# Patient Record
Sex: Female | Born: 1990
Health system: Southern US, Community
[De-identification: ages and names within clinical notes are randomized; demographics above are authoritative.]

## PROBLEM LIST (undated history)

## (undated) DIAGNOSIS — Z3492 Encounter for supervision of normal pregnancy, unspecified, second trimester: Secondary | ICD-10-CM

## (undated) HISTORY — PX: NO PAST SURGERIES: SHX2092

## (undated) HISTORY — PX: DENTAL SURGERY: SHX609

## (undated) HISTORY — DX: Encounter for supervision of normal pregnancy, unspecified, second trimester: Z34.92

---
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
## 2010-04-22 ENCOUNTER — Emergency Department (HOSPITAL_COMMUNITY)
Admission: EM | Admit: 2010-04-22 | Discharge: 2010-04-22 | Disposition: A | Discharge status: Home / Self Care | Payer: Self-pay | Attending: Emergency Medicine | Admitting: Emergency Medicine

## 2010-04-22 DIAGNOSIS — R112 Nausea with vomiting, unspecified: Secondary | ICD-10-CM | POA: Insufficient documentation

## 2010-04-22 DIAGNOSIS — R5381 Other malaise: Secondary | ICD-10-CM | POA: Insufficient documentation

## 2010-04-22 DIAGNOSIS — R197 Diarrhea, unspecified: Secondary | ICD-10-CM | POA: Insufficient documentation

## 2010-04-29 LAB — DIFFERENTIAL
Basophils Relative: 0 % (ref 0–1)
Eosinophils Absolute: 0 10*3/uL (ref 0.0–0.7)
Monocytes Absolute: 1.6 10*3/uL — ABNORMAL HIGH (ref 0.1–1.0)
Neutro Abs: 13.3 10*3/uL — ABNORMAL HIGH (ref 1.7–7.7)
Neutrophils Relative %: 82 % — ABNORMAL HIGH (ref 43–77)

## 2010-04-29 LAB — CBC
Hemoglobin: 12.5 g/dL (ref 12.0–15.0)
MCHC: 34 g/dL (ref 30.0–36.0)
RDW: 13.8 % (ref 11.5–15.5)

## 2010-04-29 LAB — MONONUCLEOSIS SCREEN: Mono Screen: NEGATIVE

## 2010-04-29 LAB — BASIC METABOLIC PANEL
CO2: 27 mEq/L (ref 19–32)
Calcium: 9.1 mg/dL (ref 8.4–10.5)
Glucose, Bld: 94 mg/dL (ref 70–99)
Sodium: 136 mEq/L (ref 135–145)

## 2012-12-28 ENCOUNTER — Encounter (HOSPITAL_COMMUNITY): Payer: Self-pay | Admitting: Emergency Medicine

## 2012-12-28 ENCOUNTER — Emergency Department (HOSPITAL_COMMUNITY)
Admission: EM | Admit: 2012-12-28 | Discharge: 2012-12-28 | Disposition: A | Discharge status: Home / Self Care | Payer: Medicaid Other | Attending: Emergency Medicine | Admitting: Emergency Medicine

## 2012-12-28 DIAGNOSIS — Z3490 Encounter for supervision of normal pregnancy, unspecified, unspecified trimester: Secondary | ICD-10-CM

## 2012-12-28 DIAGNOSIS — Z87891 Personal history of nicotine dependence: Secondary | ICD-10-CM | POA: Insufficient documentation

## 2012-12-28 DIAGNOSIS — O210 Mild hyperemesis gravidarum: Secondary | ICD-10-CM | POA: Insufficient documentation

## 2012-12-28 DIAGNOSIS — O2390 Unspecified genitourinary tract infection in pregnancy, unspecified trimester: Secondary | ICD-10-CM | POA: Insufficient documentation

## 2012-12-28 DIAGNOSIS — N390 Urinary tract infection, site not specified: Secondary | ICD-10-CM | POA: Insufficient documentation

## 2012-12-28 DIAGNOSIS — Z79899 Other long term (current) drug therapy: Secondary | ICD-10-CM | POA: Insufficient documentation

## 2012-12-28 LAB — URINE MICROSCOPIC-ADD ON

## 2012-12-28 LAB — URINALYSIS, ROUTINE W REFLEX MICROSCOPIC
Glucose, UA: NEGATIVE mg/dL
Hgb urine dipstick: NEGATIVE
Ketones, ur: 40 mg/dL — AB
pH: 6.5 (ref 5.0–8.0)

## 2012-12-28 MED ORDER — CEPHALEXIN 500 MG PO CAPS: 500.0000 mg | ORAL_CAPSULE | Freq: Four times a day (QID) | ORAL | Status: DC

## 2012-12-28 MED ORDER — SODIUM CHLORIDE 0.9 % IV BOLUS (SEPSIS)
1000.0000 mL | Freq: Once | INTRAVENOUS | Status: AC
  Administered 2012-12-28: 1000 mL via INTRAVENOUS

## 2012-12-28 MED ORDER — ONDANSETRON HCL 4 MG/2ML IJ SOLN
4.0000 mg | Freq: Once | INTRAMUSCULAR | Status: AC
  Administered 2012-12-28: 4 mg via INTRAVENOUS
  Filled 2012-12-28: qty 2

## 2012-12-28 MED ORDER — PROMETHAZINE HCL 25 MG PO TABS: 25.0000 mg | ORAL_TABLET | Freq: Four times a day (QID) | ORAL | Status: DC | PRN

## 2012-12-28 NOTE — Progress Notes (Signed)
ED/CM noted patient did not have health insurance and/or PCP listed in the computer.  Patient was given the [facility] with information on the clinics, food pantries.   Patient expressed appreciation for information received.

## 2012-12-28 NOTE — ED Notes (Signed)
Pt states that she is feeling better, ready to go home, Dr. Adriana Simas notified,

## 2012-12-28 NOTE — ED Notes (Signed)
Pt ambulatory to restroom, tolerated well,  

## 2012-12-28 NOTE — ED Notes (Signed)
Pt c/o severe n/v. Pt is approximately 6 weeks. Pt has first ob appt with [facility] on 11/20.

## 2012-12-28 NOTE — ED Provider Notes (Signed)
CSN: [id number]     Arrival date & time 12/28/12  1020 History  This chart was scribed for Donnetta Hutching, MD by Leone Payor, ED Scribe. This patient was seen in room APA06/APA06 and the patient's care was started 11:24 AM.    Chief Complaint  Patient presents with  . Morning Sickness    The history is provided by the patient. No language interpreter was used.    HPI Comments: Marie Rivera is a 22 y.o. female who presents to the Emergency Department complaining of constant, unchanged nausea and emesis that began in the last few days. Pt states she is G1 and about [redacted] weeks pregnant. Her LNMP was in September. She has her first OB appointment at [facility] on 12/30/12. She denies vaginal bleeding, vaginal discharge.   History reviewed. No pertinent past medical history. History reviewed. No pertinent past surgical history. No family history on file. History  Substance Use Topics  . Smoking status: Former Games developer  . Smokeless tobacco: Not on file  . Alcohol Use: Not on file   OB History   Grav Para Term Preterm Abortions TAB SAB Ect Mult Living   1              Review of Systems A complete 10 system review of systems was obtained and all systems are negative except as noted in the HPI and PMH.   Allergies  Review of patient's allergies indicates no known allergies.  Home Medications   Current Outpatient Rx  Name  Route  Sig  Dispense  Refill  . Prenatal Vit-Fe Fumarate-FA (PRENATAL MULTIVITAMIN) TABS tablet   Oral   Take 1 tablet by mouth daily at 12 noon.          BP 98/49  Pulse 94  Temp(Src) 98.8 F (37.1 C)  Resp 18  Ht 5\' 2"  (1.575 m)  Wt 146 lb (66.225 kg)  BMI 26.70 kg/m2  SpO2 100%  LMP 11/04/2012 Physical Exam  Nursing note and vitals reviewed. Constitutional: She is oriented to person, place, and time. She appears well-developed and well-nourished.  HENT:  Head: Normocephalic and atraumatic.  Eyes: Conjunctivae and EOM are normal. Pupils are equal,  round, and reactive to light.  Neck: Normal range of motion. Neck supple.  Cardiovascular: Normal rate, regular rhythm and normal heart sounds.   Pulmonary/Chest: Effort normal and breath sounds normal.  Abdominal: Soft. Bowel sounds are normal. There is no tenderness.  Musculoskeletal: Normal range of motion.  Neurological: She is alert and oriented to person, place, and time.  Skin: Skin is warm and dry.  Psychiatric: She has a normal mood and affect.    ED Course  Procedures   DIAGNOSTIC STUDIES: Oxygen Saturation is 100% on RA, normal by my interpretation.    COORDINATION OF CARE: 11:24 AM Will order UA. Discussed treatment plan with pt at bedside and pt agreed to plan.   Medications  sodium chloride 0.9 % bolus 1,000 mL (not administered)  sodium chloride 0.9 % bolus 1,000 mL (not administered)  ondansetron (ZOFRAN) injection 4 mg (not administered)     Labs Review Labs Reviewed  URINALYSIS, ROUTINE W REFLEX MICROSCOPIC - Abnormal; Notable for the following:    Bilirubin Urine SMALL (*)    Ketones, ur 40 (*)    Protein, ur TRACE (*)    Leukocytes, UA MODERATE (*)    All other components within normal limits  URINE MICROSCOPIC-ADD ON - Abnormal; Notable for the following:    Squamous  Epithelial / LPF MANY (*)    Bacteria, UA MANY (*)    All other components within normal limits  URINE CULTURE   Imaging Review No results found.  EKG Interpretation   None       MDM  No diagnosis found. Patient feels much better after 2 L of IV fluids.   Urinalysis shows minor evidence of infection. Prescriptions for Phenergan 25 mg and Keflex 500 mg. Urine culture.  She has ob followup within the week   I personally performed the services described in this documentation, which was scribed in my presence. The recorded information has been reviewed and is accurate.    Donnetta Hutching, MD 12/28/12 [phone]

## 2012-12-29 ENCOUNTER — Other Ambulatory Visit: Payer: Self-pay | Admitting: Obstetrics & Gynecology

## 2012-12-29 DIAGNOSIS — O3680X0 Pregnancy with inconclusive fetal viability, not applicable or unspecified: Secondary | ICD-10-CM

## 2012-12-29 LAB — URINE CULTURE
Colony Count: NO GROWTH
Culture: NO GROWTH

## 2012-12-30 ENCOUNTER — Other Ambulatory Visit: Payer: Self-pay

## 2013-02-10 NOTE — L&D Delivery Note (Signed)
Delivery Note At 3:52 AM a viable and healthy female was delivered via Vaginal, Spontaneous Delivery (Presentation: Right Occiput Anterior).  APGAR: 9, 9; weight pending .   Placenta status: Intact, Spontaneous.  Cord: 3 vessels with the following complications: None.    Anesthesia: Epidural  Episiotomy: None Lacerations: 1st degree;Labial bilateral Suture Repair: vicryl rapide Est. Blood Loss (mL): 250  Mom to postpartum.  Baby to Couplet care / Skin to Skin.  Pt admitted with SROM and 5.5 cm dilation in active labor.  Pt progressed well to complete, labored down x1.5 hours, then pushed less than 1 hour prior to delivery. Infant placed skin to skin on mother's chest immediately after delivery.  Active management of third stage with cord traction and IV Pitocin.  Cord clamping delayed by several minutes then clamped by CNM and cut by FOB.  Pt and infant to transfer to mother/baby as couplet care without complication.  [name], Allani Reber 07/25/2013, 4:26 AM

## 2013-03-15 ENCOUNTER — Encounter (HOSPITAL_COMMUNITY): Payer: Self-pay | Admitting: Emergency Medicine

## 2013-03-15 ENCOUNTER — Emergency Department (HOSPITAL_COMMUNITY)
Admission: EM | Admit: 2013-03-15 | Discharge: 2013-03-15 | Disposition: A | Discharge status: Home / Self Care | Payer: Medicaid Other | Attending: Emergency Medicine | Admitting: Emergency Medicine

## 2013-03-15 ENCOUNTER — Emergency Department (HOSPITAL_COMMUNITY): Payer: Medicaid Other

## 2013-03-15 DIAGNOSIS — O2390 Unspecified genitourinary tract infection in pregnancy, unspecified trimester: Secondary | ICD-10-CM | POA: Insufficient documentation

## 2013-03-15 DIAGNOSIS — Z79899 Other long term (current) drug therapy: Secondary | ICD-10-CM | POA: Insufficient documentation

## 2013-03-15 DIAGNOSIS — O99330 Smoking (tobacco) complicating pregnancy, unspecified trimester: Secondary | ICD-10-CM | POA: Insufficient documentation

## 2013-03-15 DIAGNOSIS — N390 Urinary tract infection, site not specified: Secondary | ICD-10-CM

## 2013-03-15 DIAGNOSIS — O209 Hemorrhage in early pregnancy, unspecified: Secondary | ICD-10-CM | POA: Insufficient documentation

## 2013-03-15 DIAGNOSIS — R109 Unspecified abdominal pain: Secondary | ICD-10-CM | POA: Insufficient documentation

## 2013-03-15 DIAGNOSIS — R102 Pelvic and perineal pain: Secondary | ICD-10-CM

## 2013-03-15 DIAGNOSIS — Z3490 Encounter for supervision of normal pregnancy, unspecified, unspecified trimester: Secondary | ICD-10-CM

## 2013-03-15 LAB — CBC WITH DIFFERENTIAL/PLATELET
Basophils Absolute: 0 10*3/uL (ref 0.0–0.1)
Basophils Relative: 0 % (ref 0–1)
EOS ABS: 0.4 10*3/uL (ref 0.0–0.7)
Eosinophils Relative: 3 % (ref 0–5)
HCT: 31.4 % — ABNORMAL LOW (ref 36.0–46.0)
HEMOGLOBIN: 10.3 g/dL — AB (ref 12.0–15.0)
LYMPHS ABS: 1.8 10*3/uL (ref 0.7–4.0)
Lymphocytes Relative: 14 % (ref 12–46)
MCH: 26.1 pg (ref 26.0–34.0)
MCHC: 32.8 g/dL (ref 30.0–36.0)
MCV: 79.5 fL (ref 78.0–100.0)
Monocytes Absolute: 1 10*3/uL (ref 0.1–1.0)
Monocytes Relative: 8 % (ref 3–12)
NEUTROS ABS: 9.7 10*3/uL — AB (ref 1.7–7.7)
NEUTROS PCT: 75 % (ref 43–77)
PLATELETS: 289 10*3/uL (ref 150–400)
RBC: 3.95 MIL/uL (ref 3.87–5.11)
RDW: 14.1 % (ref 11.5–15.5)
WBC: 13 10*3/uL — AB (ref 4.0–10.5)

## 2013-03-15 LAB — COMPREHENSIVE METABOLIC PANEL
ALBUMIN: 3.4 g/dL — AB (ref 3.5–5.2)
ALK PHOS: 84 U/L (ref 39–117)
ALT: 35 U/L (ref 0–35)
AST: 17 U/L (ref 0–37)
BILIRUBIN TOTAL: 0.2 mg/dL — AB (ref 0.3–1.2)
BUN: 8 mg/dL (ref 6–23)
CHLORIDE: 100 meq/L (ref 96–112)
CO2: 21 mEq/L (ref 19–32)
Calcium: 9 mg/dL (ref 8.4–10.5)
Creatinine, Ser: 0.59 mg/dL (ref 0.50–1.10)
GFR calc non Af Amer: >90 mL/min (ref >90)
GLUCOSE: 97 mg/dL (ref 70–99)
POTASSIUM: 3.7 meq/L (ref 3.7–5.3)
SODIUM: 135 meq/L — AB (ref 137–147)
TOTAL PROTEIN: 7.5 g/dL (ref 6.0–8.3)

## 2013-03-15 LAB — URINE MICROSCOPIC-ADD ON

## 2013-03-15 LAB — URINALYSIS, ROUTINE W REFLEX MICROSCOPIC
BILIRUBIN URINE: NEGATIVE
GLUCOSE, UA: NEGATIVE mg/dL
KETONES UR: NEGATIVE mg/dL
Nitrite: NEGATIVE
PH: 6 (ref 5.0–8.0)
Protein, ur: NEGATIVE mg/dL
Specific Gravity, Urine: 1.025 (ref 1.005–1.030)
Urobilinogen, UA: 0.2 mg/dL (ref 0.0–1.0)

## 2013-03-15 LAB — WET PREP, GENITAL
Clue Cells Wet Prep HPF POC: NONE SEEN
TRICH WET PREP: NONE SEEN
YEAST WET PREP: NONE SEEN

## 2013-03-15 LAB — ABO/RH: ABO/RH(D): A POS

## 2013-03-15 LAB — HCG, QUANTITATIVE, PREGNANCY: hCG, Beta Chain, Quant, S: 31782 m[IU]/mL — ABNORMAL HIGH (ref <5)

## 2013-03-15 MED ORDER — CEPHALEXIN 500 MG PO CAPS: 500.0000 mg | ORAL_CAPSULE | Freq: Four times a day (QID) | ORAL | Status: DC

## 2013-03-15 NOTE — Discharge Instructions (Signed)
Abdominal Pain During Pregnancy Belly (abdominal) pain is common during pregnancy. Most of the time, it is not a serious problem. Other times, it can be a sign that something is wrong with the pregnancy. Always tell your doctor if you have belly pain. HOME CARE Monitor your belly pain for any changes. The following actions may help you feel better:  Do not have sex (intercourse) or put anything in your vagina until you feel better.  Rest until your pain stops.  Drink clear fluids if you feel sick to your stomach (nauseous). Do not eat solid food until you feel better.  Only take medicine as told by your doctor.  Keep all doctor visits as told. GET HELP RIGHT AWAY IF:   You are bleeding, leaking fluid, or pieces of tissue come out of your vagina.  You have more pain or cramping.  You keep throwing up (vomiting).  You have pain when you pee (urinate) or have blood in your pee.  You have a fever.  You do not feel your baby moving as much.  You feel very weak or feel like passing out.  You have trouble breathing, with or without belly pain.  You have a very bad headache and belly pain.  You have fluid leaking from your vagina and belly pain.  You keep having watery poop (diarrhea).  Your belly pain does not go away after resting, or the pain gets worse. MAKE SURE YOU:   Understand these instructions.  Will watch your condition.  Will get help right away if you are not doing well or get worse. Document Released: 01/15/2009 Document Revised: 09/29/2012 Document Reviewed: 08/26/2012 [facility] Patient Information 2014 [place]Care, [place].  Urinary Tract Infection A urinary tract infection (UTI) can occur any place along the urinary tract. The tract includes the kidneys, ureters, bladder, and urethra. A type of germ called bacteria often causes a UTI. UTIs are often helped with antibiotic medicine.  HOME CARE   If given, take antibiotics as told by your doctor. Finish them even  if you start to feel better.  Drink enough fluids to keep your pee (urine) clear or pale yellow.  Avoid tea, drinks with caffeine, and bubbly (carbonated) drinks.  Pee often. Avoid holding your pee in for a long time.  Pee before and after having sex (intercourse).  Wipe from front to back after you poop (bowel movement) if you are a woman. Use each tissue only once. GET HELP RIGHT AWAY IF:   You have back pain.  You have lower belly (abdominal) pain.  You have chills.  You feel sick to your stomach (nauseous).  You throw up (vomit).  Your burning or discomfort with peeing does not go away.  You have a fever.  Your symptoms are not better in 3 days. MAKE SURE YOU:   Understand these instructions.  Will watch your condition.  Will get help right away if you are not doing well or get worse. Document Released: 07/16/2007 Document Revised: 10/22/2011 Document Reviewed: 08/28/2011 [facility] Patient Information 2014 [place]Care, [place]LL[place].

## 2013-03-15 NOTE — ED Notes (Addendum)
Patient [redacted] weeks pregnant .Patient c/o constant headache and some lower abd cramping 2 days ago with small amount of blood. Patient reports taking ibuprofen almost daily.  Patient states "I also just wake up feeling miserable."  Patient reports has not been able to see OB yet due to no ride. Patient denies feeling any fetal activity.

## 2013-03-15 NOTE — ED Provider Notes (Signed)
CSN: [id number]     Arrival date & time 03/15/13  1531 History  This chart was scribed for Gilda Crease, MD by Ardelia Mems, ED Scribe. This patient was seen in room APA01/APA01 and the patient's care was started at 4:47 PM.   Chief Complaint  Patient presents with  . Headache  . Abdominal Cramping    The history is provided by the patient. No language interpreter was used.    HPI Comments: Marie Rivera is a 23 y.o. Female, who is [redacted] weeks pregnant, who presents to the Emergency Department complaining of intermittent, mild abdominal pain described as "cramping" which occurred and resolved 2 days ago. She also states that she noticed a very small amount of vaginal bleeding 2 days ago, when wiping, which concerned her. She also reports that she has not been feeling like her normal self lately and she mentioned in triage that she also had a headache 2 days ago. She denies noticing any fetal activity, such as kicking. She reports that she hasn't had an [place] since she first found out she was pregnant. She also states that she hasn't had any OB care since discovering she was pregnant, and that she has not made an appointment for this due to not having a car.   History reviewed. No pertinent past medical history. History reviewed. No pertinent past surgical history. Family History  Problem Relation Age of Onset  . Diabetes Mother    History  Substance Use Topics  . Smoking status: Current Every Day Smoker -- 0.02 packs/day for 2 years    Types: Cigarettes  . Smokeless tobacco: Never Used  . Alcohol Use: No   OB History   Grav Para Term Preterm Abortions TAB SAB Ect Mult Living   1              Review of Systems  Gastrointestinal: Positive for abdominal pain (resolved).  Genitourinary: Positive for vaginal bleeding (resolved).  Neurological: Positive for headaches (resolved).  All other systems reviewed and are negative.   Allergies  Review of patient's allergies  indicates no known allergies.  Home Medications   Current Outpatient Rx  Name  Route  Sig  Dispense  Refill  . ibuprofen (ADVIL,MOTRIN) 200 MG tablet   Oral   Take 200 mg by mouth every 6 (six) hours as needed.         . Prenatal Vit-Fe Fumarate-FA (PRENATAL MULTIVITAMIN) TABS tablet   Oral   Take 1 tablet by mouth daily at 12 noon.          Triage Vitals: BP 103/58  Pulse 98  Temp(Src) 98.6 F (37 C) (Oral)  Resp 16  Ht 5\' 2"  (1.575 m)  Wt 140 lb (63.504 kg)  BMI 25.60 kg/m2  SpO2 98%  LMP 11/04/2012  Physical Exam  Constitutional: She is oriented to person, place, and time. She appears well-developed and well-nourished. No distress.  HENT:  Head: Normocephalic and atraumatic.  Right Ear: Hearing normal.  Left Ear: Hearing normal.  Nose: Nose normal.  Mouth/Throat: Oropharynx is clear and moist and mucous membranes are normal.  Eyes: Conjunctivae and EOM are normal. Pupils are equal, round, and reactive to light.  Neck: Normal range of motion. Neck supple.  Cardiovascular: Regular rhythm, S1 normal and S2 normal.  Exam reveals no gallop and no friction rub.   No murmur heard. Pulmonary/Chest: Effort normal and breath sounds normal. No respiratory distress. She exhibits no tenderness.  Abdominal: Soft. Normal appearance and  bowel sounds are normal. There is no hepatosplenomegaly. There is no tenderness. There is no rebound, no guarding, no tenderness at McBurney's point and negative Murphy's sign. No hernia.  Gravid, but non-tender abdomen  Genitourinary: Vagina normal. There is no rash or tenderness on the right labia. There is no rash or tenderness on the left labia. Uterus is enlarged (c/w dates). Cervix exhibits friability. Cervix exhibits no motion tenderness. Right adnexum displays no mass and no tenderness. Left adnexum displays no mass and no tenderness.  Musculoskeletal: Normal range of motion.  Neurological: She is alert and oriented to person, place, and  time. She has normal strength. No cranial nerve deficit or sensory deficit. Coordination normal. GCS eye subscore is 4. GCS verbal subscore is 5. GCS motor subscore is 6.  Skin: Skin is warm, dry and intact. No rash noted. No cyanosis.  Psychiatric: She has a normal mood and affect. Her speech is normal and behavior is normal. Thought content normal.    ED Course  Procedures (including critical care time)  DIAGNOSTIC STUDIES: Oxygen Saturation is 98% on RA, normal by my interpretation.    COORDINATION OF CARE: 4:53 PM- Discussed plan to obtain an [place] along with diagnostic lab work. Will also perform a pelvic exam. Pt advised of plan for treatment and pt agrees.  Labs Review Labs Reviewed  CBC WITH DIFFERENTIAL - Abnormal; Notable for the following:    WBC 13.0 (*)    Hemoglobin 10.3 (*)    HCT 31.4 (*)    Neutro Abs 9.7 (*)    All other components within normal limits  COMPREHENSIVE METABOLIC PANEL - Abnormal; Notable for the following:    Sodium 135 (*)    Albumin 3.4 (*)    Total Bilirubin 0.2 (*)    All other components within normal limits  WET PREP, GENITAL  GC/CHLAMYDIA PROBE AMP  URINALYSIS, ROUTINE W REFLEX MICROSCOPIC  HCG, QUANTITATIVE, PREGNANCY  ABO/RH   Imaging Review US Ob Limited  03/15/2013   CLINICAL DATA:  Pregnant, cramping, no fetal heart tones  EXAM: LIMITED OBSTETRIC ULTRASOUND  FINDINGS: Number of Fetuses: 1  Heart Rate:  168 bpm  Movement: Yes  Presentation: Breech  Placental Location: Anterior  Previa: No  Amniotic Fluid (Subjective):  Within normal limits.  BPD:  4.19 cm 18 w 5 d  MATERNAL FINDINGS:  Cervix:  Appears closed.  Uterus/Adnexae:  No abnormality visualized.  IMPRESSION: Single live intrauterine gestation with positive fetal heart tones, as described above.  This exam is performed on an emergent basis and does not comprehensively evaluate fetal size, dating, or anatomy; follow-up complete OB US should be considered if further fetal assessment is  warranted.   Electronically Signed   By: Charline Bills M.D.   On: 03/15/2013 17:27    EKG Interpretation   None       MDM  Diagnosis: 1. Pregnancy 2. Abdominal cramping 3. Vaginal bleeding 4. UTI  Patient presents to the ER for evaluation of mild, intermittent abdominal cramping which is not currently present. She also had some vaginal spotting 2 days ago, which she described as blood on the tissue when she wiped. It was not heavy like a period. She describes a slight headache. She has normal neurologic exam. Examination was entirely unremarkable here in the ER. Ultrasound was performed because she is approximately [redacted] weeks pregnant without prenatal care. Ultrasound shows normal intrauterine pregnancy. Pelvic exam revealed cervical friability, likely the source of the slight spotting, but no other abnormalities.  Her urinalysis did suggest infection, and this might be a source of blood on the tissue when she wipes as well. Will be treated with Keflex. Patient reassured, Tylenol as needed for pain followup with OB/GYN.  I personally performed the services described in this documentation, which was scribed in my presence. The recorded information has been reviewed and is accurate.    Gilda Creasehristopher J. Alazar Cherian, MD 03/15/13 [id number]

## 2013-03-16 LAB — URINE CULTURE
COLONY COUNT: NO GROWTH
CULTURE: NO GROWTH

## 2013-03-16 LAB — GC/CHLAMYDIA PROBE AMP
CT PROBE, AMP APTIMA: POSITIVE — AB
GC Probe RNA: NEGATIVE

## 2013-03-17 NOTE — ED Notes (Signed)
Chart sent to EDP office for review.+ Chlamydia 

## 2013-03-19 ENCOUNTER — Telehealth (HOSPITAL_COMMUNITY): Payer: Self-pay | Admitting: Emergency Medicine

## 2013-03-20 ENCOUNTER — Telehealth (HOSPITAL_BASED_OUTPATIENT_CLINIC_OR_DEPARTMENT_OTHER): Payer: Self-pay | Admitting: Emergency Medicine

## 2013-03-21 ENCOUNTER — Encounter: Payer: Self-pay | Admitting: Women's Health

## 2013-03-22 ENCOUNTER — Encounter: Payer: Self-pay | Admitting: Advanced Practice Midwife

## 2013-03-22 NOTE — ED Notes (Signed)
Patient informed of positive results and wants rx called to [facility] [phone]

## 2013-03-29 ENCOUNTER — Encounter: Payer: Self-pay | Admitting: Adult Health

## 2013-04-04 ENCOUNTER — Other Ambulatory Visit (HOSPITAL_COMMUNITY)
Admission: RE | Admit: 2013-04-04 | Discharge: 2013-04-04 | Disposition: A | Discharge status: Home / Self Care | Payer: Medicaid Other | Source: Ambulatory Visit | Attending: Adult Health | Admitting: Adult Health

## 2013-04-04 ENCOUNTER — Ambulatory Visit (INDEPENDENT_AMBULATORY_CARE_PROVIDER_SITE_OTHER): Payer: Medicaid Other | Admitting: Adult Health

## 2013-04-04 ENCOUNTER — Other Ambulatory Visit: Payer: Self-pay | Admitting: Adult Health

## 2013-04-04 ENCOUNTER — Encounter: Payer: Self-pay | Admitting: Adult Health

## 2013-04-04 VITALS — BP 100/64 | Wt 145.0 lb

## 2013-04-04 DIAGNOSIS — Z1389 Encounter for screening for other disorder: Secondary | ICD-10-CM

## 2013-04-04 DIAGNOSIS — Z01419 Encounter for gynecological examination (general) (routine) without abnormal findings: Secondary | ICD-10-CM | POA: Insufficient documentation

## 2013-04-04 DIAGNOSIS — O0990 Supervision of high risk pregnancy, unspecified, unspecified trimester: Secondary | ICD-10-CM | POA: Insufficient documentation

## 2013-04-04 DIAGNOSIS — Z331 Pregnant state, incidental: Secondary | ICD-10-CM

## 2013-04-04 DIAGNOSIS — Z113 Encounter for screening for infections with a predominantly sexual mode of transmission: Secondary | ICD-10-CM | POA: Insufficient documentation

## 2013-04-04 DIAGNOSIS — O99019 Anemia complicating pregnancy, unspecified trimester: Secondary | ICD-10-CM

## 2013-04-04 DIAGNOSIS — Z3400 Encounter for supervision of normal first pregnancy, unspecified trimester: Secondary | ICD-10-CM

## 2013-04-04 DIAGNOSIS — Z3492 Encounter for supervision of normal pregnancy, unspecified, second trimester: Secondary | ICD-10-CM

## 2013-04-04 HISTORY — DX: Encounter for supervision of normal pregnancy, unspecified, second trimester: Z34.92

## 2013-04-04 LAB — POCT URINALYSIS DIPSTICK
Blood, UA: NEGATIVE
Glucose, UA: NEGATIVE
Ketones, UA: NEGATIVE
LEUKOCYTES UA: NEGATIVE
Nitrite, UA: NEGATIVE
PROTEIN UA: NEGATIVE

## 2013-04-04 LAB — CBC
HCT: 29.7 % — ABNORMAL LOW (ref 36.0–46.0)
Hemoglobin: 10.1 g/dL — ABNORMAL LOW (ref 12.0–15.0)
MCH: 26.2 pg (ref 26.0–34.0)
MCHC: 34 g/dL (ref 30.0–36.0)
MCV: 76.9 fL — ABNORMAL LOW (ref 78.0–100.0)
Platelets: 302 10*3/uL (ref 150–400)
RBC: 3.86 MIL/uL — ABNORMAL LOW (ref 3.87–5.11)
RDW: 15.7 % — AB (ref 11.5–15.5)
WBC: 13.2 10*3/uL — ABNORMAL HIGH (ref 4.0–10.5)

## 2013-04-04 NOTE — Progress Notes (Signed)
Subjective:  Marie Rivera is a 23 y.o. G1P0 African American female at 3938w1d by US being seen today for her first obstetrical visit.  Her obstetrical history is significant for late presentation.  Pregnancy history fully reviewed.  Patient reports no complaints. Denies vb, cramping, uti s/s, abnormal/malodorous vag d/c, or vulvovaginal itching/irritation.  BP 100/64  Wt 145 lb (65.772 kg)  LMP 11/04/2012  HISTORY: OB History  Gravida Para Term Preterm AB SAB TAB Ectopic Multiple Living  1             # Outcome Date GA Lbr Len/2nd Weight Sex Delivery Anes PTL Lv  1 CUR              Past Medical History  Diagnosis Date  . Supervision of normal pregnancy in second trimester 04/04/2013   History reviewed. No pertinent past surgical history. Family History  Problem Relation Age of Onset  . Diabetes Mother   . Hypertension Mother   . Cancer Maternal Aunt   . Diabetes Maternal Grandmother     Exam   System:     General: Well developed & nourished, no acute distress   Skin: Warm & dry, normal coloration and turgor, no rashes   Neurologic: Alert & oriented, normal mood   Cardiovascular: Regular rate & rhythm   Respiratory: Effort & rate normal, LCTAB, acyanotic   Abdomen: Soft, non tender   Extremities: normal strength, tone   Pelvic Exam:    Perineum: Normal perineum   Vulva: Normal, no lesions   Vagina:  Normal mucosa, normal discharge   Cervix: Normal, bulbous, appears closed   Uterus: Normal size/shape/contour for GA   Thin prep pap smear with GC/CHL done FHR 164 via doppler   Assessment:   Pregnancy: G1P0 Patient Active Problem List   Diagnosis Date Noted  . Supervision of normal pregnancy in second trimester 04/04/2013    7038w1d G1P0 New OB visit     Plan:  Initial labs drawn Continue prenatal vitamins Problem list reviewed and updated Reviewed n/v relief measures and warning s/s to report Reviewed recommended weight gain based on pre-gravid  BMI Encouraged well-balanced diet Genetic Screening discussed Quad Screen: requested Cystic fibrosis screening discussed requested Ultrasound discussed; fetal survey: requested Follow up in 2 weeks for US and see [name]  Adline PotterJennifer A. Griffin, NP 04/04/2013 3:34 PM

## 2013-04-04 NOTE — Patient Instructions (Signed)
Second Trimester of Pregnancy The second trimester is from week 13 through week 28, months 4 through 6. The second trimester is often a time when you feel your best. Your body has also adjusted to being pregnant, and you begin to feel better physically. Usually, morning sickness has lessened or quit completely, you may have more energy, and you may have an increase in appetite. The second trimester is also a time when the fetus is growing rapidly. At the end of the sixth month, the fetus is about 9 inches long and weighs about 1 pounds. You will likely begin to feel the baby move (quickening) between 18 and 20 weeks of the pregnancy. BODY CHANGES Your body goes through many changes during pregnancy. The changes vary from woman to woman.   Your weight will continue to increase. You will notice your lower abdomen bulging out.  You may begin to get stretch marks on your hips, abdomen, and breasts.  You may develop headaches that can be relieved by medicines approved by your caregiver.  You may urinate more often because the fetus is pressing on your bladder.  You may develop or continue to have heartburn as a result of your pregnancy.  You may develop constipation because certain hormones are causing the muscles that push waste through your intestines to slow down.  You may develop hemorrhoids or swollen, bulging veins (varicose veins).  You may have back pain because of the weight gain and pregnancy hormones relaxing your joints between the bones in your pelvis and as a result of a shift in weight and the muscles that support your balance.  Your breasts will continue to grow and be tender.  Your gums may bleed and may be sensitive to brushing and flossing.  Dark spots or blotches (chloasma, mask of pregnancy) may develop on your face. This will likely fade after the baby is born.  A dark line from your belly button to the pubic area (linea nigra) may appear. This will likely fade after the  baby is born. WHAT TO EXPECT AT YOUR PRENATAL VISITS During a routine prenatal visit:  You will be weighed to make sure you and the fetus are growing normally.  Your blood pressure will be taken.  Your abdomen will be measured to track your baby's growth.  The fetal heartbeat will be listened to.  Any test results from the previous visit will be discussed. Your caregiver may ask you:  How you are feeling.  If you are feeling the baby move.  If you have had any abnormal symptoms, such as leaking fluid, bleeding, severe headaches, or abdominal cramping.  If you have any questions. Other tests that may be performed during your second trimester include:  Blood tests that check for:  Low iron levels (anemia).  Gestational diabetes (between 24 and 28 weeks).  Rh antibodies.  Urine tests to check for infections, diabetes, or protein in the urine.  An ultrasound to confirm the proper growth and development of the baby.  An amniocentesis to check for possible genetic problems.  Fetal screens for spina bifida and Down syndrome. HOME CARE INSTRUCTIONS   Avoid all smoking, herbs, alcohol, and unprescribed drugs. These chemicals affect the formation and growth of the baby.  Follow your caregiver's instructions regarding medicine use. There are medicines that are either safe or unsafe to take during pregnancy.  Exercise only as directed by your caregiver. Experiencing uterine cramps is a good sign to stop exercising.  Continue to eat regular,   healthy meals.  Wear a good support bra for breast tenderness.  Do not use hot tubs, steam rooms, or saunas.  Wear your seat belt at all times when driving.  Avoid raw meat, uncooked cheese, cat litter boxes, and soil used by cats. These carry germs that can cause birth defects in the baby.  Take your prenatal vitamins.  Try taking a stool softener (if your caregiver approves) if you develop constipation. Eat more high-fiber foods,  such as fresh vegetables or fruit and whole grains. Drink plenty of fluids to keep your urine clear or pale yellow.  Take warm sitz baths to soothe any pain or discomfort caused by hemorrhoids. Use hemorrhoid cream if your caregiver approves.  If you develop varicose veins, wear support hose. Elevate your feet for 15 minutes, 3 4 times a day. Limit salt in your diet.  Avoid heavy lifting, wear low heel shoes, and practice good posture.  Rest with your legs elevated if you have leg cramps or low back pain.  Visit your dentist if you have not gone yet during your pregnancy. Use a soft toothbrush to brush your teeth and be gentle when you floss.  A sexual relationship may be continued unless your caregiver directs you otherwise.  Continue to go to all your prenatal visits as directed by your caregiver. SEEK MEDICAL CARE IF:   You have dizziness.  You have mild pelvic cramps, pelvic pressure, or nagging pain in the abdominal area.  You have persistent nausea, vomiting, or diarrhea.  You have a bad smelling vaginal discharge.  You have pain with urination. SEEK IMMEDIATE MEDICAL CARE IF:   You have a fever.  You are leaking fluid from your vagina.  You have spotting or bleeding from your vagina.  You have severe abdominal cramping or pain.  You have rapid weight gain or loss.  You have shortness of breath with chest pain.  You notice sudden or extreme swelling of your face, hands, ankles, feet, or legs.  You have not felt your baby move in over an hour.  You have severe headaches that do not go away with medicine.  You have vision changes. Document Released: 01/21/2001 Document Revised: 09/29/2012 Document Reviewed: 03/30/2012 [facility] Patient Information 2014 [place] HillExitCare, [place]LLC. Return in 2 weeks for [place]

## 2013-04-04 NOTE — Progress Notes (Signed)
Pt denies any problems or concerns at this time. Pt given CCNC form and lab consent to read over and sign.

## 2013-04-05 LAB — AFP, QUAD SCREEN
AFP: 89.7 IU/mL
Age Alone: 1:1130 {titer}
CURR GEST AGE: 21.4 wks.days
Down Syndrome Scr Risk Est: 1:6150 {titer}
HCG, Total: 17017 m[IU]/mL
INH: 319.1 pg/mL
INTERPRETATION-AFP: NEGATIVE
MOM FOR AFP: 1.32
MoM for INH: 1.59
MoM for hCG: 1.27
Open Spina bifida: NEGATIVE
Tri 18 Scr Risk Est: NEGATIVE
Trisomy 18 (Edward) Syndrome Interp.: 1:130000 {titer}
UE3 MOM: 0.97
UE3 VALUE: 1.5 ng/mL

## 2013-04-05 LAB — URINALYSIS
Bilirubin Urine: NEGATIVE
Glucose, UA: NEGATIVE mg/dL
HGB URINE DIPSTICK: NEGATIVE
Ketones, ur: NEGATIVE mg/dL
Nitrite: NEGATIVE
PROTEIN: NEGATIVE mg/dL
Specific Gravity, Urine: 1.03 (ref 1.005–1.030)
UROBILINOGEN UA: 0.2 mg/dL (ref 0.0–1.0)
pH: 6.5 (ref 5.0–8.0)

## 2013-04-05 LAB — RPR

## 2013-04-05 LAB — SICKLE CELL SCREEN: Sickle Cell Screen: NEGATIVE

## 2013-04-05 LAB — DRUG SCREEN, URINE, NO CONFIRMATION
AMPHETAMINE SCRN UR: NEGATIVE
BENZODIAZEPINES.: NEGATIVE
Barbiturate Quant, Ur: NEGATIVE
Cocaine Metabolites: NEGATIVE
Creatinine,U: 255.3 mg/dL
MARIJUANA METABOLITE: NEGATIVE
Methadone: NEGATIVE
Opiate Screen, Urine: NEGATIVE
PHENCYCLIDINE (PCP): NEGATIVE
PROPOXYPHENE: NEGATIVE

## 2013-04-05 LAB — ABO AND RH: RH TYPE: POSITIVE

## 2013-04-05 LAB — RUBELLA SCREEN: Rubella: 1.42 Index — ABNORMAL HIGH (ref <0.90)

## 2013-04-05 LAB — ANTIBODY SCREEN: ANTIBODY SCREEN: NEGATIVE

## 2013-04-05 LAB — HIV ANTIBODY (ROUTINE TESTING W REFLEX): HIV: NONREACTIVE

## 2013-04-05 LAB — URINE CULTURE
Colony Count: NO GROWTH
Organism ID, Bacteria: NO GROWTH

## 2013-04-05 LAB — HEPATITIS B SURFACE ANTIGEN: Hepatitis B Surface Ag: NEGATIVE

## 2013-04-05 LAB — TSH: TSH: 0.705 u[IU]/mL (ref 0.350–4.500)

## 2013-04-05 LAB — OXYCODONE SCREEN, UA, RFLX CONFIRM: Oxycodone Screen, Ur: NEGATIVE ng/mL

## 2013-04-05 LAB — VARICELLA ZOSTER ANTIBODY, IGG: Varicella IgG: 1863 Index — ABNORMAL HIGH (ref <135.00)

## 2013-04-06 ENCOUNTER — Encounter: Payer: Self-pay | Admitting: Adult Health

## 2013-04-06 LAB — CYSTIC FIBROSIS DIAGNOSTIC STUDY

## 2013-04-08 ENCOUNTER — Encounter: Payer: Self-pay | Admitting: Adult Health

## 2013-04-08 ENCOUNTER — Telehealth: Payer: Self-pay | Admitting: Adult Health

## 2013-04-08 NOTE — Telephone Encounter (Signed)
Spoke with pt. Pt has a sore throat, body feels weak, and headache. Started last night. Fever last night. Taking Tylenol. Advised to gargle warm salt water and take throat lozenges. Also, advised to increase fluids and rest as much as possible. Advised to call Monday if she feels like she needs to be seen. If pt starts wheezing or having shortness of breath over the weekend, [facility]. Pt voiced understanding. JSY

## 2013-04-12 ENCOUNTER — Telehealth: Payer: Self-pay

## 2013-04-12 NOTE — Telephone Encounter (Signed)
Continues to have a cough, no fever, no wheezing or shortness of breath. Pt states cough is causing her to have a headache. Pt informed can take robitussin DM, if no improvement to call office back.

## 2013-04-18 ENCOUNTER — Other Ambulatory Visit: Payer: Self-pay | Admitting: Obstetrics & Gynecology

## 2013-04-18 ENCOUNTER — Encounter: Payer: Self-pay | Admitting: Women's Health

## 2013-04-18 ENCOUNTER — Encounter (INDEPENDENT_AMBULATORY_CARE_PROVIDER_SITE_OTHER): Payer: Self-pay

## 2013-04-18 ENCOUNTER — Ambulatory Visit (INDEPENDENT_AMBULATORY_CARE_PROVIDER_SITE_OTHER): Payer: Medicaid Other

## 2013-04-18 ENCOUNTER — Ambulatory Visit (INDEPENDENT_AMBULATORY_CARE_PROVIDER_SITE_OTHER): Payer: Medicaid Other | Admitting: Women's Health

## 2013-04-18 VITALS — BP 98/52 | Wt 150.5 lb

## 2013-04-18 DIAGNOSIS — O3430 Maternal care for cervical incompetence, unspecified trimester: Secondary | ICD-10-CM

## 2013-04-18 DIAGNOSIS — O26879 Cervical shortening, unspecified trimester: Secondary | ICD-10-CM

## 2013-04-18 DIAGNOSIS — Z1389 Encounter for screening for other disorder: Secondary | ICD-10-CM

## 2013-04-18 DIAGNOSIS — O358XX0 Maternal care for other (suspected) fetal abnormality and damage, not applicable or unspecified: Secondary | ICD-10-CM

## 2013-04-18 DIAGNOSIS — Z3492 Encounter for supervision of normal pregnancy, unspecified, second trimester: Secondary | ICD-10-CM

## 2013-04-18 DIAGNOSIS — O09299 Supervision of pregnancy with other poor reproductive or obstetric history, unspecified trimester: Secondary | ICD-10-CM | POA: Insufficient documentation

## 2013-04-18 DIAGNOSIS — Z331 Pregnant state, incidental: Secondary | ICD-10-CM

## 2013-04-18 DIAGNOSIS — O3432 Maternal care for cervical incompetence, second trimester: Secondary | ICD-10-CM

## 2013-04-18 LAB — POCT URINALYSIS DIPSTICK
Glucose, UA: NEGATIVE
KETONES UA: NEGATIVE
Nitrite, UA: NEGATIVE
Protein, UA: NEGATIVE
RBC UA: NEGATIVE

## 2013-04-18 MED ORDER — PROGESTERONE MICRONIZED 200 MG PO CAPS: 200.0000 mg | ORAL_CAPSULE | Freq: Every day | ORAL | Status: DC

## 2013-04-18 NOTE — Progress Notes (Signed)
U/S(23+1wks)-active fetus, meas c/w dates, fluid wnl, anterior Gr 0 placenta, FHR-161 bpm, bilateral adnexa wnl, female fetus, no major abnl noted other than mild bilateral hydronephrosis noted in a female fetus Lt-555mm and Rt-614mm, CX- 1.4cm with funnel noted  (measured vaginally)

## 2013-04-18 NOTE — Patient Instructions (Signed)
[place] Pediatricians:  [facility] [phone]            [facility] [phone]                 [facility] [phone] (usually doesn't accept new patients unless you have family there already, you are always welcome to call and ask)             [facility] ([address] [place]) [phone]   [facility]:   [facility]: [phone]  [facility]: [phone]   Cervical Insufficiency  Cervical insufficiency is when the cervix is weak and starts to open (dilate) and thin (efface) before the pregnancy is at term and without labor starting. This is also called incompetent cervix. It can happen in the second or third trimester when the fetus starts putting pressure on the cervix. Cervical insufficiency can lead to a miscarriage, preterm premature rupture of the membranes (PPROM), or having the baby early (preterm birth).  RISK FACTORS You may be more likely to develop cervical insufficiency if:  You have a shorter cervix than normal.  Damage or injury occurred to your cervix from a past pregnancy or surgery.  You were born with a cervical defect.  You have had procedure done on the cervix, such as cervical biopsy.  You have a history of cervical insufficiency.  You have a history of PPROM.  You have ended several past pregnancies through abortion.  You were exposed to the drug diethylstilbestrol (DES). SYMPTOMS Often times, women do not have any symptoms. Other times, woman may only have mild symptoms that often start between week 14 through 20. The symptoms may last several days or weeks. These symptoms include:  Light spotting or bleeding from the vagina.  Pelvic pressure.  A change in vaginal discharge, such as discharge that changes from clear, white, or light yellow to pink or tan.  Back pain.  Abdominal pain or cramping. DIAGNOSIS Cervical  insufficiency cannot be diagnosed before you become pregnant. Once you are pregnant, your caregiver will ask about your medical history and if you have had any problems in past pregnancies. Tell your caregiver about any procedures performed on your cervix or if you have a history of miscarriages or cervical insufficiency. If your caregiver thinks you are at high risk for cervical insufficiency or show signs of cervical insufficiency, he or she may:  Perform a pelvic exam. This will check for:  The presence of the membranes (amniotic sac) coming out of the cervix.  Cervical abnormalities.  Cervical injuries.  The presence of contractions.  Perform an ultrasonography (commonly called ultrasound) to measure the length and thickness of the cervix. TREATMENT If you have been diagnosed with cervical insufficiency, your caregiver may recommend:  Limiting physical activity.  Bed rest at home or in the hospital.  Pelvic rest, which means no sexual intercourse or placing anything in the vagina.  Cerclage to sew the cervix closed and prevent it from opening too early. The stitches (sutures) are removed between weeks 36 and 38 to avoid problems during labor. Cerclage may be recommended during pregnancy if you have had a history of miscarriages or preterm births without a known cause. It may also be recommended if you have a short cervix that was identified by ultrasound or if your caregiver has found that your cervix has dilated before 24 weeks of pregnancy. Limiting physical activity and bed rest may or may not help prevent a  preterm birth. WHEN SHOULD YOU SEEK IMMEDIATE MEDICAL CARE?  Seek immediate medical care if you show any symptoms of cervical insufficiency. You will need to go to the hospital to get checked immediately. Document Released: 01/27/2005 Document Revised: 09/29/2012 Document Reviewed: 04/05/2012 [facility] Patient Information 2014 [place], [place]. Second Trimester of Pregnancy The  second trimester is from week 13 through week 28, months 4 through 6. The second trimester is often a time when you feel your best. Your body has also adjusted to being pregnant, and you begin to feel better physically. Usually, morning sickness has lessened or quit completely, you may have more energy, and you may have an increase in appetite. The second trimester is also a time when the fetus is growing rapidly. At the end of the sixth month, the fetus is about 9 inches long and weighs about 1 pounds. You will likely begin to feel the baby move (quickening) between 18 and 20 weeks of the pregnancy. BODY CHANGES Your body goes through many changes during pregnancy. The changes vary from woman to woman.   Your weight will continue to increase. You will notice your lower abdomen bulging out.  You may begin to get stretch marks on your hips, abdomen, and breasts.  You may develop headaches that can be relieved by medicines approved by your caregiver.  You may urinate more often because the fetus is pressing on your bladder.  You may develop or continue to have heartburn as a result of your pregnancy.  You may develop constipation because certain hormones are causing the muscles that push waste through your intestines to slow down.  You may develop hemorrhoids or swollen, bulging veins (varicose veins).  You may have back pain because of the weight gain and pregnancy hormones relaxing your joints between the bones in your pelvis and as a result of a shift in weight and the muscles that support your balance.  Your breasts will continue to grow and be tender.  Your gums may bleed and may be sensitive to brushing and flossing.  Dark spots or blotches (chloasma, mask of pregnancy) may develop on your face. This will likely fade after the baby is born.  A dark line from your belly button to the pubic area (linea nigra) may appear. This will likely fade after the baby is born. WHAT TO EXPECT AT  YOUR PRENATAL VISITS During a routine prenatal visit:  You will be weighed to make sure you and the fetus are growing normally.  Your blood pressure will be taken.  Your abdomen will be measured to track your baby's growth.  The fetal heartbeat will be listened to.  Any test results from the previous visit will be discussed. Your caregiver may ask you:  How you are feeling.  If you are feeling the baby move.  If you have had any abnormal symptoms, such as leaking fluid, bleeding, severe headaches, or abdominal cramping.  If you have any questions. Other tests that may be performed during your second trimester include:  Blood tests that check for:  Low iron levels (anemia).  Gestational diabetes (between 24 and 28 weeks).  Rh antibodies.  Urine tests to check for infections, diabetes, or protein in the urine.  An ultrasound to confirm the proper growth and development of the baby.  An amniocentesis to check for possible genetic problems.  Fetal screens for spina bifida and Down syndrome. HOME CARE INSTRUCTIONS   Avoid all smoking, herbs, alcohol, and unprescribed drugs. These chemicals affect the  formation and growth of the baby.  Follow your caregiver's instructions regarding medicine use. There are medicines that are either safe or unsafe to take during pregnancy.  Exercise only as directed by your caregiver. Experiencing uterine cramps is a good sign to stop exercising.  Continue to eat regular, healthy meals.  Wear a good support bra for breast tenderness.  Do not use hot tubs, steam rooms, or saunas.  Wear your seat belt at all times when driving.  Avoid raw meat, uncooked cheese, cat litter boxes, and soil used by cats. These carry germs that can cause birth defects in the baby.  Take your prenatal vitamins.  Try taking a stool softener (if your caregiver approves) if you develop constipation. Eat more high-fiber foods, such as fresh vegetables or fruit  and whole grains. Drink plenty of fluids to keep your urine clear or pale yellow.  Take warm sitz baths to soothe any pain or discomfort caused by hemorrhoids. Use hemorrhoid cream if your caregiver approves.  If you develop varicose veins, wear support hose. Elevate your feet for 15 minutes, 3 4 times a day. Limit salt in your diet.  Avoid heavy lifting, wear low heel shoes, and practice good posture.  Rest with your legs elevated if you have leg cramps or low back pain.  Visit your dentist if you have not gone yet during your pregnancy. Use a soft toothbrush to brush your teeth and be gentle when you floss.  A sexual relationship may be continued unless your caregiver directs you otherwise.  Continue to go to all your prenatal visits as directed by your caregiver. SEEK MEDICAL CARE IF:   You have dizziness.  You have mild pelvic cramps, pelvic pressure, or nagging pain in the abdominal area.  You have persistent nausea, vomiting, or diarrhea.  You have a bad smelling vaginal discharge.  You have pain with urination. SEEK IMMEDIATE MEDICAL CARE IF:   You have a fever.  You are leaking fluid from your vagina.  You have spotting or bleeding from your vagina.  You have severe abdominal cramping or pain.  You have rapid weight gain or loss.  You have shortness of breath with chest pain.  You notice sudden or extreme swelling of your face, hands, ankles, feet, or legs.  You have not felt your baby move in over an hour.  You have severe headaches that do not go away with medicine.  You have vision changes. Document Released: 01/21/2001 Document Revised: 09/29/2012 Document Reviewed: 03/30/2012 [facility] Patient Information 2014 [place], [place].

## 2013-04-18 NOTE — Progress Notes (Signed)
Reports good fm. Denies uc's, lof, vb, uti s/s.  No complaints.  Reviewed today's anatomy u/s, CL 1.4cm w/ funneling- discussed w/ LHE, too late for cerclage- will rx prometrium pv q hs and recheck CL in 1wk.  All questions answered. F/U in 1wk for u/s and visit.

## 2013-04-26 ENCOUNTER — Encounter: Payer: Self-pay | Admitting: Advanced Practice Midwife

## 2013-04-26 ENCOUNTER — Other Ambulatory Visit: Payer: Self-pay | Admitting: Women's Health

## 2013-04-26 ENCOUNTER — Ambulatory Visit (INDEPENDENT_AMBULATORY_CARE_PROVIDER_SITE_OTHER): Payer: Medicaid Other | Admitting: Advanced Practice Midwife

## 2013-04-26 ENCOUNTER — Encounter (INDEPENDENT_AMBULATORY_CARE_PROVIDER_SITE_OTHER): Payer: Self-pay

## 2013-04-26 ENCOUNTER — Ambulatory Visit (INDEPENDENT_AMBULATORY_CARE_PROVIDER_SITE_OTHER): Payer: Medicaid Other

## 2013-04-26 VITALS — BP 124/58 | Wt 150.0 lb

## 2013-04-26 DIAGNOSIS — Z3492 Encounter for supervision of normal pregnancy, unspecified, second trimester: Secondary | ICD-10-CM

## 2013-04-26 DIAGNOSIS — O99019 Anemia complicating pregnancy, unspecified trimester: Secondary | ICD-10-CM

## 2013-04-26 DIAGNOSIS — O3430 Maternal care for cervical incompetence, unspecified trimester: Secondary | ICD-10-CM

## 2013-04-26 DIAGNOSIS — O3432 Maternal care for cervical incompetence, second trimester: Secondary | ICD-10-CM

## 2013-04-26 DIAGNOSIS — O26879 Cervical shortening, unspecified trimester: Secondary | ICD-10-CM

## 2013-04-26 DIAGNOSIS — O0932 Supervision of pregnancy with insufficient antenatal care, second trimester: Secondary | ICD-10-CM

## 2013-04-26 DIAGNOSIS — Z331 Pregnant state, incidental: Secondary | ICD-10-CM

## 2013-04-26 DIAGNOSIS — O0930 Supervision of pregnancy with insufficient antenatal care, unspecified trimester: Secondary | ICD-10-CM

## 2013-04-26 DIAGNOSIS — Z1389 Encounter for screening for other disorder: Secondary | ICD-10-CM

## 2013-04-26 LAB — POCT URINALYSIS DIPSTICK
Blood, UA: NEGATIVE
GLUCOSE UA: NEGATIVE
Ketones, UA: NEGATIVE
Leukocytes, UA: NEGATIVE
Nitrite, UA: NEGATIVE

## 2013-04-26 NOTE — Progress Notes (Signed)
Started prometrium 1 week ago for incidental finding of 1.4cm cx.  Today 1/5cm without funneling.  No contractions  . Will culture urine, aymptomatic, but + protein.  Recheck cx 2 weeks with u/s.

## 2013-04-26 NOTE — Progress Notes (Signed)
U/S(24+2wks)-transvaginal u/s performed, vtx active fetus, FHR- 156 bpm, fluid appears WNL, Fundal Anterior Gr 0 placenta noted, Cx-1.55cm (measured vaginally) no change noted with fundal pressure applied

## 2013-04-26 NOTE — Patient Instructions (Signed)
1. Before your test, do not eat or drink anything for 8-10 hours prior to your  appointment (a small amount of water is allowed and you may take any medicines you normally take). 2. When you arrive, your blood will be drawn for a 'fasting' blood sugar level.  Then you will be given a sweetened carbonated beverage to drink. You should  complete drinking this beverage within five minutes. After finishing the  beverage, you will have your blood drawn exactly 1 and 2 hours later. Having  your blood drawn on time is an important part of this test. A total of three blood  samples will be done. 3. The test takes approximately 2  hours. During the test, do not have anything to  eat or drink. Do not smoke, chew gum (not even sugarless gum) or use breath mints.  4. During the test you should remain close by and seated as much as possible and  avoid walking around. You may want to bring a book or something else to  occupy your time.  5. After your test, you may eat and drink as normal. You may want to bring a snack  to eat after the test is finished. Your provider will advise you as to the results of  this test and any follow-up if necessary  You will also be retested for syphilis, HIV and blood levels (anemia):  You were already tested in the first trimester, but [place] recommends retesting.  Additionally, you will be tested for Type 2 Herpes. MOST people do not know that they have genital herpes, as only around 15% of people have outbreaks.  However, it is still transmittable to other people, including the baby (but only during the birth).  If you test positive for Type 2 Herpes, we place you on a medicine called acyclovir the last 6 weeks of your pregnancy to prevent transmission of the virus to the baby during the birth.    If your sugar test is positive for gestational diabetes, you will be given an phone call and further instructions discussed.  We typically do not call patients with positive  herpes results, but will discuss it at your next appointment.  If you wish to know all of your test results before your next appointment, feel free to call the office, or look up your test results on Mychart.  (The range that the lab uses for normal values of the sugar test are not necessarily the range that is used for pregnant women; if your results are within the range, they are definitely normal.  However, if a value is deemed "high" by the lab, it may not be too high for a pregnant woman.  We will need to discuss the normal range if your value(s) fall in the "high" category).     

## 2013-04-27 LAB — URINE CULTURE
COLONY COUNT: NO GROWTH
Organism ID, Bacteria: NO GROWTH

## 2013-05-06 ENCOUNTER — Other Ambulatory Visit: Payer: Self-pay | Admitting: Advanced Practice Midwife

## 2013-05-06 DIAGNOSIS — O26879 Cervical shortening, unspecified trimester: Secondary | ICD-10-CM

## 2013-05-10 ENCOUNTER — Other Ambulatory Visit: Payer: Medicaid Other

## 2013-05-10 ENCOUNTER — Ambulatory Visit (INDEPENDENT_AMBULATORY_CARE_PROVIDER_SITE_OTHER): Payer: Medicaid Other

## 2013-05-10 ENCOUNTER — Encounter (INDEPENDENT_AMBULATORY_CARE_PROVIDER_SITE_OTHER): Payer: Self-pay

## 2013-05-10 ENCOUNTER — Other Ambulatory Visit: Payer: Self-pay | Admitting: Obstetrics and Gynecology

## 2013-05-10 ENCOUNTER — Ambulatory Visit (INDEPENDENT_AMBULATORY_CARE_PROVIDER_SITE_OTHER): Payer: Medicaid Other | Admitting: Women's Health

## 2013-05-10 VITALS — BP 90/60 | Wt 155.0 lb

## 2013-05-10 DIAGNOSIS — O99019 Anemia complicating pregnancy, unspecified trimester: Secondary | ICD-10-CM

## 2013-05-10 DIAGNOSIS — O358XX0 Maternal care for other (suspected) fetal abnormality and damage, not applicable or unspecified: Secondary | ICD-10-CM

## 2013-05-10 DIAGNOSIS — Z3400 Encounter for supervision of normal first pregnancy, unspecified trimester: Secondary | ICD-10-CM

## 2013-05-10 DIAGNOSIS — O0990 Supervision of high risk pregnancy, unspecified, unspecified trimester: Secondary | ICD-10-CM

## 2013-05-10 DIAGNOSIS — O26879 Cervical shortening, unspecified trimester: Secondary | ICD-10-CM

## 2013-05-10 DIAGNOSIS — Z1389 Encounter for screening for other disorder: Secondary | ICD-10-CM

## 2013-05-10 DIAGNOSIS — O0930 Supervision of pregnancy with insufficient antenatal care, unspecified trimester: Secondary | ICD-10-CM

## 2013-05-10 DIAGNOSIS — O35EXX0 Maternal care for other (suspected) fetal abnormality and damage, fetal genitourinary anomalies, not applicable or unspecified: Secondary | ICD-10-CM | POA: Insufficient documentation

## 2013-05-10 DIAGNOSIS — Z331 Pregnant state, incidental: Secondary | ICD-10-CM

## 2013-05-10 DIAGNOSIS — O3432 Maternal care for cervical incompetence, second trimester: Secondary | ICD-10-CM

## 2013-05-10 DIAGNOSIS — O3430 Maternal care for cervical incompetence, unspecified trimester: Secondary | ICD-10-CM

## 2013-05-10 LAB — GLUCOSE TOLERANCE, 2 HOURS W/ 1HR
GLUCOSE, FASTING: 84 mg/dL (ref 70–99)
Glucose, 1 hour: 125 mg/dL (ref 70–170)
Glucose, 2 hour: 95 mg/dL (ref 70–139)

## 2013-05-10 LAB — POCT URINALYSIS DIPSTICK
Blood, UA: NEGATIVE
Glucose, UA: NEGATIVE
Ketones, UA: NEGATIVE
Leukocytes, UA: NEGATIVE
Nitrite, UA: NEGATIVE
Protein, UA: NEGATIVE

## 2013-05-10 LAB — CBC
HEMATOCRIT: 28.6 % — AB (ref 36.0–46.0)
Hemoglobin: 9.6 g/dL — ABNORMAL LOW (ref 12.0–15.0)
MCH: 26.3 pg (ref 26.0–34.0)
MCHC: 33.6 g/dL (ref 30.0–36.0)
MCV: 78.4 fL (ref 78.0–100.0)
Platelets: 266 10*3/uL (ref 150–400)
RBC: 3.65 MIL/uL — AB (ref 3.87–5.11)
RDW: 14.4 % (ref 11.5–15.5)
WBC: 14.2 10*3/uL — ABNORMAL HIGH (ref 4.0–10.5)

## 2013-05-10 LAB — ANTIBODY SCREEN: Antibody Screen: NEGATIVE

## 2013-05-10 NOTE — Progress Notes (Signed)
Reports good fm. Denies uc's, lof, vb, uti s/s.  No complaints.  Reviewed today's u/s, CL increased to 1.7cm, bilat mild hydronephrosis remains. Continue prometrium. Reviewed ptl s/s, fm.  All questions answered. F/U in 2wks for CL and visit.  PN2 today.

## 2013-05-10 NOTE — Progress Notes (Signed)
U/S(26+2wks)-vtx active fetus, FHR- 151 bpm, fluid wnl, anterior Gr 1 placenta, female fetus, bilateral mild hydro remains Rt- 5.534mm and Lt-4.4 mm, cx appears closed (1.7cm) measured vaginally

## 2013-05-10 NOTE — Progress Notes (Signed)
Pt denies any problem or concerns at this time.

## 2013-05-10 NOTE — Patient Instructions (Signed)
Second Trimester of Pregnancy The second trimester is from week 13 through week 28, months 4 through 6. The second trimester is often a time when you feel your best. Your body has also adjusted to being pregnant, and you begin to feel better physically. Usually, morning sickness has lessened or quit completely, you may have more energy, and you may have an increase in appetite. The second trimester is also a time when the fetus is growing rapidly. At the end of the sixth month, the fetus is about 9 inches long and weighs about 1 pounds. You will likely begin to feel the baby move (quickening) between 18 and 20 weeks of the pregnancy. BODY CHANGES Your body goes through many changes during pregnancy. The changes vary from woman to woman.   Your weight will continue to increase. You will notice your lower abdomen bulging out.  You may begin to get stretch marks on your hips, abdomen, and breasts.  You may develop headaches that can be relieved by medicines approved by your caregiver.  You may urinate more often because the fetus is pressing on your bladder.  You may develop or continue to have heartburn as a result of your pregnancy.  You may develop constipation because certain hormones are causing the muscles that push waste through your intestines to slow down.  You may develop hemorrhoids or swollen, bulging veins (varicose veins).  You may have back pain because of the weight gain and pregnancy hormones relaxing your joints between the bones in your pelvis and as a result of a shift in weight and the muscles that support your balance.  Your breasts will continue to grow and be tender.  Your gums may bleed and may be sensitive to brushing and flossing.  Dark spots or blotches (chloasma, mask of pregnancy) may develop on your face. This will likely fade after the baby is born.  A dark line from your belly button to the pubic area (linea nigra) may appear. This will likely fade after the  baby is born. WHAT TO EXPECT AT YOUR PRENATAL VISITS During a routine prenatal visit:  You will be weighed to make sure you and the fetus are growing normally.  Your blood pressure will be taken.  Your abdomen will be measured to track your baby's growth.  The fetal heartbeat will be listened to.  Any test results from the previous visit will be discussed. Your caregiver may ask you:  How you are feeling.  If you are feeling the baby move.  If you have had any abnormal symptoms, such as leaking fluid, bleeding, severe headaches, or abdominal cramping.  If you have any questions. Other tests that may be performed during your second trimester include:  Blood tests that check for:  Low iron levels (anemia).  Gestational diabetes (between 24 and 28 weeks).  Rh antibodies.  Urine tests to check for infections, diabetes, or protein in the urine.  An ultrasound to confirm the proper growth and development of the baby.  An amniocentesis to check for possible genetic problems.  Fetal screens for spina bifida and Down syndrome. HOME CARE INSTRUCTIONS   Avoid all smoking, herbs, alcohol, and unprescribed drugs. These chemicals affect the formation and growth of the baby.  Follow your caregiver's instructions regarding medicine use. There are medicines that are either safe or unsafe to take during pregnancy.  Exercise only as directed by your caregiver. Experiencing uterine cramps is a good sign to stop exercising.  Continue to eat regular,   healthy meals.  Wear a good support bra for breast tenderness.  Do not use hot tubs, steam rooms, or saunas.  Wear your seat belt at all times when driving.  Avoid raw meat, uncooked cheese, cat litter boxes, and soil used by cats. These carry germs that can cause birth defects in the baby.  Take your prenatal vitamins.  Try taking a stool softener (if your caregiver approves) if you develop constipation. Eat more high-fiber foods,  such as fresh vegetables or fruit and whole grains. Drink plenty of fluids to keep your urine clear or pale yellow.  Take warm sitz baths to soothe any pain or discomfort caused by hemorrhoids. Use hemorrhoid cream if your caregiver approves.  If you develop varicose veins, wear support hose. Elevate your feet for 15 minutes, 3 4 times a day. Limit salt in your diet.  Avoid heavy lifting, wear low heel shoes, and practice good posture.  Rest with your legs elevated if you have leg cramps or low back pain.  Visit your dentist if you have not gone yet during your pregnancy. Use a soft toothbrush to brush your teeth and be gentle when you floss.  A sexual relationship may be continued unless your caregiver directs you otherwise.  Continue to go to all your prenatal visits as directed by your caregiver. SEEK MEDICAL CARE IF:   You have dizziness.  You have mild pelvic cramps, pelvic pressure, or nagging pain in the abdominal area.  You have persistent nausea, vomiting, or diarrhea.  You have a bad smelling vaginal discharge.  You have pain with urination. SEEK IMMEDIATE MEDICAL CARE IF:   You have a fever.  You are leaking fluid from your vagina.  You have spotting or bleeding from your vagina.  You have severe abdominal cramping or pain.  You have rapid weight gain or loss.  You have shortness of breath with chest pain.  You notice sudden or extreme swelling of your face, hands, ankles, feet, or legs.  You have not felt your baby move in over an hour.  You have severe headaches that do not go away with medicine.  You have vision changes. Document Released: 01/21/2001 Document Revised: 09/29/2012 Document Reviewed: 03/30/2012 [employer] Patient Information 2014 [employer].  

## 2013-05-11 ENCOUNTER — Telehealth: Payer: Self-pay | Admitting: Women's Health

## 2013-05-11 ENCOUNTER — Encounter: Payer: Self-pay | Admitting: Women's Health

## 2013-05-11 DIAGNOSIS — O99019 Anemia complicating pregnancy, unspecified trimester: Secondary | ICD-10-CM | POA: Insufficient documentation

## 2013-05-11 LAB — RPR

## 2013-05-11 LAB — HIV ANTIBODY (ROUTINE TESTING W REFLEX): HIV: NONREACTIVE

## 2013-05-11 LAB — HSV 2 ANTIBODY, IGG: HSV 2 Glycoprotein G Ab, IgG: <0.1 IV

## 2013-05-11 MED ORDER — FERROUS SULFATE 325 (65 FE) MG PO TABS: 325.0000 mg | ORAL_TABLET | Freq: Two times a day (BID) | ORAL | Status: DC

## 2013-05-11 NOTE — Telephone Encounter (Signed)
Called pt to notify of lab results, need for Fe supplement, which has been sent to her pharmacy. VM box has not been set up yet, will have nurse continue to try to contact pt.   Cheral MarkerKimberly R. Jordie Skalsky, CNM, WHNP-BC 05/11/2013 1:18 PM

## 2013-05-11 NOTE — Telephone Encounter (Signed)
Notified pt of anemia, ferrous sulfate rx sent to pharmacy. To increase fe-rich foods. Notified of all other PN2 labs as well.  Cheral MarkerKimberly R. Caree Wolpert, CNM, [facility]NP-BC 05/11/2013 1:41 PM

## 2013-05-12 ENCOUNTER — Telehealth: Payer: Self-pay | Admitting: Obstetrics and Gynecology

## 2013-05-12 MED ORDER — FERROUS SULFATE 325 (65 FE) MG PO TABS: 325.0000 mg | ORAL_TABLET | Freq: Two times a day (BID) | ORAL | Status: DC

## 2013-05-12 NOTE — Telephone Encounter (Signed)
Pt called and stated that Rx was sent to the wrong Pharmacy. I spoke with Drenda FreezeFran and she gave permission to send Rx to the right pharmacy.  I called pt and advised her that the Rx had been sent to the right pharmacy. Pt to call back if pharmacy doesn't have it.

## 2013-05-19 ENCOUNTER — Other Ambulatory Visit: Payer: Self-pay | Admitting: Women's Health

## 2013-05-19 DIAGNOSIS — O26879 Cervical shortening, unspecified trimester: Secondary | ICD-10-CM

## 2013-05-23 ENCOUNTER — Telehealth: Payer: Self-pay | Admitting: Obstetrics and Gynecology

## 2013-05-23 NOTE — Telephone Encounter (Signed)
Pt states given samples of Citranatal 90 DHA, how does she need to take them. Informed pt to take one tablet and one capsule daily. Pt verbalized understanding.

## 2013-05-24 ENCOUNTER — Other Ambulatory Visit: Payer: Medicaid Other

## 2013-05-24 ENCOUNTER — Encounter: Payer: Medicaid Other | Admitting: Women's Health

## 2013-05-31 ENCOUNTER — Ambulatory Visit (INDEPENDENT_AMBULATORY_CARE_PROVIDER_SITE_OTHER): Payer: Medicaid Other

## 2013-05-31 ENCOUNTER — Ambulatory Visit (INDEPENDENT_AMBULATORY_CARE_PROVIDER_SITE_OTHER): Payer: Medicaid Other | Admitting: Women's Health

## 2013-05-31 ENCOUNTER — Encounter: Payer: Self-pay | Admitting: Women's Health

## 2013-05-31 VITALS — BP 116/60 | Wt 157.2 lb

## 2013-05-31 DIAGNOSIS — Z1389 Encounter for screening for other disorder: Secondary | ICD-10-CM

## 2013-05-31 DIAGNOSIS — Z3400 Encounter for supervision of normal first pregnancy, unspecified trimester: Secondary | ICD-10-CM

## 2013-05-31 DIAGNOSIS — Z331 Pregnant state, incidental: Secondary | ICD-10-CM

## 2013-05-31 DIAGNOSIS — O3430 Maternal care for cervical incompetence, unspecified trimester: Secondary | ICD-10-CM

## 2013-05-31 DIAGNOSIS — O3432 Maternal care for cervical incompetence, second trimester: Secondary | ICD-10-CM

## 2013-05-31 DIAGNOSIS — R350 Frequency of micturition: Secondary | ICD-10-CM

## 2013-05-31 DIAGNOSIS — O358XX0 Maternal care for other (suspected) fetal abnormality and damage, not applicable or unspecified: Secondary | ICD-10-CM

## 2013-05-31 DIAGNOSIS — O0930 Supervision of pregnancy with insufficient antenatal care, unspecified trimester: Secondary | ICD-10-CM

## 2013-05-31 DIAGNOSIS — O26879 Cervical shortening, unspecified trimester: Secondary | ICD-10-CM

## 2013-05-31 LAB — POCT URINALYSIS DIPSTICK
Blood, UA: NEGATIVE
Glucose, UA: NEGATIVE
KETONES UA: NEGATIVE
NITRITE UA: NEGATIVE
PROTEIN UA: NEGATIVE

## 2013-05-31 NOTE — Patient Instructions (Addendum)
Third Trimester of Pregnancy The third trimester is from week 29 through week 42, months 7 through 9. The third trimester is a time when the fetus is growing rapidly. At the end of the ninth month, the fetus is about 20 inches in length and weighs 6 10 pounds.  BODY CHANGES Your body goes through many changes during pregnancy. The changes vary from woman to woman.   Your weight will continue to increase. You can expect to gain 25 35 pounds (11 16 kg) by the end of the pregnancy.  You may begin to get stretch marks on your hips, abdomen, and breasts.  You may urinate more often because the fetus is moving lower into your pelvis and pressing on your bladder.  You may develop or continue to have heartburn as a result of your pregnancy.  You may develop constipation because certain hormones are causing the muscles that push waste through your intestines to slow down.  You may develop hemorrhoids or swollen, bulging veins (varicose veins).  You may have pelvic pain because of the weight gain and pregnancy hormones relaxing your joints between the bones in your pelvis. Back aches may result from over exertion of the muscles supporting your posture.  Your breasts will continue to grow and be tender. A yellow discharge may leak from your breasts called colostrum.  Your belly button may stick out.  You may feel short of breath because of your expanding uterus.  You may notice the fetus "dropping," or moving lower in your abdomen.  You may have a bloody mucus discharge. This usually occurs a few days to a week before labor begins.  Your cervix becomes thin and soft (effaced) near your due date. WHAT TO EXPECT AT YOUR PRENATAL EXAMS  You will have prenatal exams every 2 weeks until week 36. Then, you will have weekly prenatal exams. During a routine prenatal visit:  You will be weighed to make sure you and the fetus are growing normally.  Your blood pressure is taken.  Your abdomen will be  measured to track your baby's growth.  The fetal heartbeat will be listened to.  Any test results from the previous visit will be discussed.  You may have a cervical check near your due date to see if you have effaced. At around 36 weeks, your caregiver will check your cervix. At the same time, your caregiver will also perform a test on the secretions of the vaginal tissue. This test is to determine if a type of bacteria, Group B streptococcus, is present. Your caregiver will explain this further. Your caregiver may ask you:  What your birth plan is.  How you are feeling.  If you are feeling the baby move.  If you have had any abnormal symptoms, such as leaking fluid, bleeding, severe headaches, or abdominal cramping.  If you have any questions. Other tests or screenings that may be performed during your third trimester include:  Blood tests that check for low iron levels (anemia).  Fetal testing to check the health, activity level, and growth of the fetus. Testing is done if you have certain medical conditions or if there are problems during the pregnancy. FALSE LABOR You may feel small, irregular contractions that eventually go away. These are called Braxton Hicks contractions, or false labor. Contractions may last for hours, days, or even weeks before true labor sets in. If contractions come at regular intervals, intensify, or become painful, it is best to be seen by your caregiver.    SIGNS OF LABOR   Menstrual-like cramps.  Contractions that are 5 minutes apart or less.  Contractions that start on the top of the uterus and spread down to the lower abdomen and back.  A sense of increased pelvic pressure or back pain.  A watery or bloody mucus discharge that comes from the vagina. If you have any of these signs before the 37th week of pregnancy, call your caregiver right away. You need to go to the hospital to get checked immediately. HOME CARE INSTRUCTIONS   Avoid all  smoking, herbs, alcohol, and unprescribed drugs. These chemicals affect the formation and growth of the baby.  Follow your caregiver's instructions regarding medicine use. There are medicines that are either safe or unsafe to take during pregnancy.  Exercise only as directed by your caregiver. Experiencing uterine cramps is a good sign to stop exercising.  Continue to eat regular, healthy meals.  Wear a good support bra for breast tenderness.  Do not use hot tubs, steam rooms, or saunas.  Wear your seat belt at all times when driving.  Avoid raw meat, uncooked cheese, cat litter boxes, and soil used by cats. These carry germs that can cause birth defects in the baby.  Take your prenatal vitamins.  Try taking a stool softener (if your caregiver approves) if you develop constipation. Eat more high-fiber foods, such as fresh vegetables or fruit and whole grains. Drink plenty of fluids to keep your urine clear or pale yellow.  Take warm sitz baths to soothe any pain or discomfort caused by hemorrhoids. Use hemorrhoid cream if your caregiver approves.  If you develop varicose veins, wear support hose. Elevate your feet for 15 minutes, 3 4 times a day. Limit salt in your diet.  Avoid heavy lifting, wear low heal shoes, and practice good posture.  Rest a lot with your legs elevated if you have leg cramps or low back pain.  Visit your dentist if you have not gone during your pregnancy. Use a soft toothbrush to brush your teeth and be gentle when you floss.  A sexual relationship may be continued unless your caregiver directs you otherwise.  Do not travel far distances unless it is absolutely necessary and only with the approval of your caregiver.  Take prenatal classes to understand, practice, and ask questions about the labor and delivery.  Make a trial run to the hospital.  Pack your hospital bag.  Prepare the baby's nursery.  Continue to go to all your prenatal visits as directed  by your caregiver. SEEK MEDICAL CARE IF:  You are unsure if you are in labor or if your water has broken.  You have dizziness.  You have mild pelvic cramps, pelvic pressure, or nagging pain in your abdominal area.  You have persistent nausea, vomiting, or diarrhea.  You have a bad smelling vaginal discharge.  You have pain with urination. SEEK IMMEDIATE MEDICAL CARE IF:   You have a fever.  You are leaking fluid from your vagina.  You have spotting or bleeding from your vagina.  You have severe abdominal cramping or pain.  You have rapid weight loss or gain.  You have shortness of breath with chest pain.  You notice sudden or extreme swelling of your face, hands, ankles, feet, or legs.  You have not felt your baby move in over an hour.  You have severe headaches that do not go away with medicine.  You have vision changes. Document Released: 01/21/2001 Document Revised: 09/29/2012 Document Reviewed:   03/30/2012 Exit[employer] Patient Information 2014 [employer].  Preterm Labor Information Preterm labor is when labor starts at less than 37 weeks of pregnancy. The normal length of a pregnancy is 39 to 41 weeks. CAUSES Often, there is no identifiable underlying cause as to why a woman goes into preterm labor. One of the most common known causes of preterm labor is infection. Infections of the uterus, cervix, vagina, amniotic sac, bladder, kidney, or even the lungs (pneumonia) can cause labor to start. Other suspected causes of preterm labor include:   Urogenital infections, such as yeast infections and bacterial vaginosis.   Uterine abnormalities (uterine shape, uterine septum, fibroids, or bleeding from the placenta).   A cervix that has been operated on (it may fail to stay closed).   Malformations in the fetus.   Multiple gestations (twins, triplets, and so on).   Breakage of the amniotic sac.  RISK FACTORS  Having a previous history of preterm labor.    Having premature rupture of membranes (PROM).   Having a placenta that covers the opening of the cervix (placenta previa).   Having a placenta that separates from the uterus (placental abruption).   Having a cervix that is too weak to hold the fetus in the uterus (incompetent cervix).   Having too much fluid in the amniotic sac (polyhydramnios).   Taking illegal drugs or smoking while pregnant.   Not gaining enough weight while pregnant.   Being younger than 18 and older than 23 years old.   Having a low socioeconomic status.   Being African American. SYMPTOMS Signs and symptoms of preterm labor include:   Menstrual-like cramps, abdominal pain, or back pain.  Uterine contractions that are regular, as frequent as six in an hour, regardless of their intensity (may be mild or painful).  Contractions that start on the top of the uterus and spread down to the lower abdomen and back.   A sense of increased pelvic pressure.   A watery or bloody mucus discharge that comes from the vagina.  TREATMENT Depending on the length of the pregnancy and other circumstances, your health care provider may suggest bed rest. If necessary, there are medicines that can be given to stop contractions and to mature the fetal lungs. If labor happens before 34 weeks of pregnancy, a prolonged hospital stay may be recommended. Treatment depends on the condition of both you and the fetus.  WHAT SHOULD YOU DO IF YOU THINK YOU ARE IN PRETERM LABOR? Call your health care provider right away. You will need to go to the hospital to get checked immediately. HOW CAN YOU PREVENT PRETERM LABOR IN FUTURE PREGNANCIES? You should:   Stop smoking if you smoke.  Maintain healthy weight gain and avoid chemicals and drugs that are not necessary.  Be watchful for any type of infection.  Inform your health care provider if you have a known history of preterm labor. Document Released: 04/19/2003 Document  Revised: 09/29/2012 Document Reviewed: 03/01/2012 Exit[employer] Patient Information 2014 [employer].  

## 2013-05-31 NOTE — Progress Notes (Signed)
U/S(29+2wks)-single active vertex fetus, FHR- 160 bpm, fluid appears WNL, anterior Gr 1 placenta, CX=1.4cm with small amount of funneling noted with fundal pressure applied although no change noted in measurement with fundal pressure applied

## 2013-05-31 NOTE — Progress Notes (Signed)
Reports good fm. Denies uc's, lof, vb, uti s/s.  Hands/feet swollen the other day- none now.  Reviewed today's u/s- CL down to 1.4 from 1.7, now w/ sm amt funneling w/ fundal pressure- pt admits to not taking prometrium since last visit b/c she just 'got lazy'- discussed w/ JVF who recommended proceeding w/ BMZ.  Pt doesn't want to do BMZ at this point- scared of any potential SE, reviewed potential SE w/ pt, still uncomfortable. Will do fFN tomorrow- 24hrs after vag u/s, and if pos pt agrees to BMZ. All questions answered. F/U in 1d for fFN and visit.

## 2013-06-01 ENCOUNTER — Ambulatory Visit: Payer: Medicaid Other | Admitting: Women's Health

## 2013-06-01 ENCOUNTER — Encounter: Payer: Self-pay | Admitting: Women's Health

## 2013-06-01 ENCOUNTER — Ambulatory Visit (INDEPENDENT_AMBULATORY_CARE_PROVIDER_SITE_OTHER): Payer: Medicaid Other | Admitting: Women's Health

## 2013-06-01 VITALS — BP 100/52 | Wt 158.0 lb

## 2013-06-01 DIAGNOSIS — O0990 Supervision of high risk pregnancy, unspecified, unspecified trimester: Secondary | ICD-10-CM

## 2013-06-01 DIAGNOSIS — O09899 Supervision of other high risk pregnancies, unspecified trimester: Secondary | ICD-10-CM

## 2013-06-01 DIAGNOSIS — O0930 Supervision of pregnancy with insufficient antenatal care, unspecified trimester: Secondary | ICD-10-CM

## 2013-06-01 DIAGNOSIS — O358XX0 Maternal care for other (suspected) fetal abnormality and damage, not applicable or unspecified: Secondary | ICD-10-CM

## 2013-06-01 DIAGNOSIS — Z331 Pregnant state, incidental: Secondary | ICD-10-CM

## 2013-06-01 DIAGNOSIS — O99019 Anemia complicating pregnancy, unspecified trimester: Secondary | ICD-10-CM

## 2013-06-01 DIAGNOSIS — Z1389 Encounter for screening for other disorder: Secondary | ICD-10-CM

## 2013-06-01 DIAGNOSIS — O3430 Maternal care for cervical incompetence, unspecified trimester: Secondary | ICD-10-CM

## 2013-06-01 DIAGNOSIS — O26873 Cervical shortening, third trimester: Secondary | ICD-10-CM

## 2013-06-01 LAB — POCT URINALYSIS DIPSTICK
Blood, UA: NEGATIVE
Glucose, UA: NEGATIVE
KETONES UA: NEGATIVE
LEUKOCYTES UA: NEGATIVE
Nitrite, UA: NEGATIVE

## 2013-06-01 LAB — URINE CULTURE
Colony Count: NO GROWTH
Organism ID, Bacteria: NO GROWTH

## 2013-06-01 NOTE — Patient Instructions (Signed)
Constipation  Drink plenty of fluid, preferably water, throughout the day  Eat foods high in fiber such as fruits, vegetables, and grains  Drink warm fluids, especially warm prune juice, or decaf coffee  Eat a 1/2 cup of real oatmeal (not instant), 1/2 cup applesauce, and 1/2-1 cup warm prune juice every day  If needed, you may take Colace (docusate sodium) stool softener once or twice a day to help keep the stool soft. If you are pregnant, wait until you are out of your first trimester (12-14 weeks of pregnancy)  If you still are having problems with constipation, you may take Miralax once daily as needed to help keep your bowels regular.  If you are pregnant, wait until you are out of your first trimester (12-14 weeks of pregnancy)    Fetal Fibronectin This is a test done to help evaluate a pregnant woman's risk of pre-term delivery. It is generally done when you are 26 to [redacted] weeks pregnant and are having symptoms of premature labor. A Dacron swab is used to take a sample of cervical or vaginal fluid from the back portion of the vagina or from the area just outside the opening of the cervix. Fetal fibronectin (fFN) is a glycoprotein that can be used to help predict the short term risk of premature delivery. fFN is produced at the boundary between the amniotic sac and the lining of the mother's uterus. This is called the unteroplacental junction. Fetal fibronectin is largely confined to this junction and thought to help maintain the integrity of the boundary. fFN is normally detectable in cervicovaginal fluid during the first 20 to 24 weeks of pregnancy, and then is detectable again after about 36 weeks.  Finding fFN in cervicovaginal fluids after 36 weeks is not unusual as it is often released by the body as it gets ready for childbirth. The elevated fFN found in vaginal fluids early in pregnancy may simply reflect the normal growth and establishment of tissues at the unteroplacental junction  with levels falling when this phase is complete. What is known is that fFN that is detected between 24 and 36 weeks of pregnancy is not normal. Elevated levels reflect a disturbance at the uteroplacental junction and have been associated with an increased risk of pre-term labor and delivery. Knowing whether or not a woman is likely to deliver prematurely helps your caregiver plan a course of action. The fFN test is a relatively non-invasive tool to help the caregiver to distinguish between those who are likely to deliver shortly and those who are not.  PREPARATION FOR TEST   Inform the person conducting the test if you have a medical condition or are using any medications that cause excessive bleeding.  Do not have sexual intercourse for 24 hours before the procedure. NORMAL FINDINGS  Pregnancy = 50 nanograms/ml Ranges for normal findings may vary among different laboratories and hospitals. You should always check with your doctor after having lab work or other tests done to discuss the meaning of your test results and whether your values are considered within normal limits. MEANING OF TEST  Your caregiver will go over the test results with you and discuss the importance and meaning of your results, as well as treatment options and the need for additional tests if necessary. OBTAINING THE TEST RESULTS  It is your responsibility to obtain your test results. Ask the lab or department performing the test when and how you will get your results. Document Released: 11/29/2003 Document Revised: 04/21/2011 Document  Reviewed: 01/07/2008 Exit[employer] Patient Information 2014 [place]

## 2013-06-01 NOTE — Addendum Note (Signed)
Addended by: Colen DarlingYOUNG, Carle Fenech S on: 06/01/2013 04:30 PM   Modules accepted: Orders

## 2013-06-01 NOTE — Progress Notes (Signed)
Reports good fm. Denies uc's, lof, vb, uti s/s.  Occ Rt calf pain. No edema, erythema, streaking, cords, neg Homan's. Reviewed s/s DVT w/ pt- if worsens to let us know [facility]. Nothing in vagina last 24hrs, fFN collected. If + will bring in for BMZ.  Some constipation- reviewed relief measures, do not want straining d/t short cx, so recommended daily colace. Reviewed ptl s/s, fkc.  All questions answered. F/U in 2wks for CL u/s and visit.

## 2013-06-02 LAB — FETAL FIBRONECTIN: FETAL FIBRONECTIN: NEGATIVE

## 2013-06-16 ENCOUNTER — Ambulatory Visit (INDEPENDENT_AMBULATORY_CARE_PROVIDER_SITE_OTHER): Payer: Medicaid Other | Admitting: Advanced Practice Midwife

## 2013-06-16 ENCOUNTER — Encounter: Payer: Self-pay | Admitting: Advanced Practice Midwife

## 2013-06-16 ENCOUNTER — Other Ambulatory Visit: Payer: Medicaid Other

## 2013-06-16 ENCOUNTER — Other Ambulatory Visit: Payer: Self-pay | Admitting: Obstetrics & Gynecology

## 2013-06-16 ENCOUNTER — Other Ambulatory Visit: Payer: Self-pay | Admitting: Women's Health

## 2013-06-16 ENCOUNTER — Ambulatory Visit (INDEPENDENT_AMBULATORY_CARE_PROVIDER_SITE_OTHER): Payer: Medicaid Other

## 2013-06-16 VITALS — BP 124/60 | Wt 162.0 lb

## 2013-06-16 DIAGNOSIS — O358XX0 Maternal care for other (suspected) fetal abnormality and damage, not applicable or unspecified: Secondary | ICD-10-CM

## 2013-06-16 DIAGNOSIS — Z1389 Encounter for screening for other disorder: Secondary | ICD-10-CM

## 2013-06-16 DIAGNOSIS — O26879 Cervical shortening, unspecified trimester: Secondary | ICD-10-CM

## 2013-06-16 DIAGNOSIS — O26873 Cervical shortening, third trimester: Secondary | ICD-10-CM

## 2013-06-16 DIAGNOSIS — O3430 Maternal care for cervical incompetence, unspecified trimester: Secondary | ICD-10-CM

## 2013-06-16 DIAGNOSIS — O99019 Anemia complicating pregnancy, unspecified trimester: Secondary | ICD-10-CM

## 2013-06-16 DIAGNOSIS — O0990 Supervision of high risk pregnancy, unspecified, unspecified trimester: Secondary | ICD-10-CM

## 2013-06-16 DIAGNOSIS — O09899 Supervision of other high risk pregnancies, unspecified trimester: Secondary | ICD-10-CM

## 2013-06-16 DIAGNOSIS — O26849 Uterine size-date discrepancy, unspecified trimester: Secondary | ICD-10-CM

## 2013-06-16 DIAGNOSIS — O0930 Supervision of pregnancy with insufficient antenatal care, unspecified trimester: Secondary | ICD-10-CM

## 2013-06-16 DIAGNOSIS — Z331 Pregnant state, incidental: Secondary | ICD-10-CM

## 2013-06-16 LAB — POCT URINALYSIS DIPSTICK
GLUCOSE UA: NEGATIVE
Ketones, UA: NEGATIVE
NITRITE UA: NEGATIVE
Protein, UA: NEGATIVE

## 2013-06-16 NOTE — Progress Notes (Signed)
Cx length essentially stable. fFn negative 4/22.  Will continue prometrium until term.    No c/o at this time.  Routine questions about pregnancy answered.  F/U in 2 weeks for Low-risk ob appt

## 2013-06-16 NOTE — Progress Notes (Signed)
U/S(32+0wks)-vtx active fetus, FHR- 162 bpm, fluid wnl SDP-2.8cm, EFW 3lb 14 oz (38th%tile), anterior Gr 1 placenta, cx = 1.2cm closed (measured vaginally)

## 2013-06-16 NOTE — Patient Instructions (Signed)
Ron:  Ask your insurance company:  If you can add your newborn son to your insurance;  If "yes", then ask if circumcision is covered.  If yes, then ask if it is covered in either the hospital or the doctor's office.  If yes, ask them for verification of these benefits to bring to the hospital.

## 2013-06-20 ENCOUNTER — Encounter: Payer: Self-pay | Admitting: Women's Health

## 2013-06-30 ENCOUNTER — Ambulatory Visit (INDEPENDENT_AMBULATORY_CARE_PROVIDER_SITE_OTHER): Payer: Self-pay | Admitting: Advanced Practice Midwife

## 2013-06-30 ENCOUNTER — Encounter: Payer: Self-pay | Admitting: Advanced Practice Midwife

## 2013-06-30 VITALS — BP 100/50 | Wt 164.5 lb

## 2013-06-30 DIAGNOSIS — Z1389 Encounter for screening for other disorder: Secondary | ICD-10-CM

## 2013-06-30 DIAGNOSIS — Z331 Pregnant state, incidental: Secondary | ICD-10-CM

## 2013-06-30 DIAGNOSIS — Z3400 Encounter for supervision of normal first pregnancy, unspecified trimester: Secondary | ICD-10-CM

## 2013-06-30 LAB — POCT URINALYSIS DIPSTICK
Blood, UA: NEGATIVE
GLUCOSE UA: NEGATIVE
Ketones, UA: NEGATIVE
NITRITE UA: NEGATIVE
Protein, UA: NEGATIVE

## 2013-06-30 NOTE — Progress Notes (Signed)
No c/o at this time.  Routine questions about pregnancy answered.  F/U in 2 weeks for Low-risk ob appt .   

## 2013-07-07 ENCOUNTER — Telehealth: Payer: Self-pay | Admitting: Obstetrics & Gynecology

## 2013-07-07 ENCOUNTER — Encounter: Payer: Self-pay | Admitting: Obstetrics & Gynecology

## 2013-07-07 ENCOUNTER — Ambulatory Visit (INDEPENDENT_AMBULATORY_CARE_PROVIDER_SITE_OTHER): Payer: Self-pay | Admitting: Obstetrics & Gynecology

## 2013-07-07 VITALS — BP 102/60 | Wt 168.0 lb

## 2013-07-07 DIAGNOSIS — Z331 Pregnant state, incidental: Secondary | ICD-10-CM

## 2013-07-07 DIAGNOSIS — Z1389 Encounter for screening for other disorder: Secondary | ICD-10-CM

## 2013-07-07 DIAGNOSIS — Z3400 Encounter for supervision of normal first pregnancy, unspecified trimester: Secondary | ICD-10-CM

## 2013-07-07 LAB — POCT URINALYSIS DIPSTICK
Blood, UA: NEGATIVE
GLUCOSE UA: NEGATIVE
KETONES UA: NEGATIVE
Nitrite, UA: NEGATIVE
Protein, UA: NEGATIVE

## 2013-07-07 NOTE — Progress Notes (Signed)
Baby is all up in them ribs, tender costochondritis on right, left is negative, liver is not tender  G1P0 [redacted]w[redacted]d Estimated Date of Delivery: 08/11/13   BP weight and urine results all reviewed and noted. Patient reports good fetal movement, denies any bleeding and no rupture of membranes symptoms or regular contractions.  Patient is without complaints. All questions were answered.  Follow up in 1 weeks for routine OB appt

## 2013-07-07 NOTE — Telephone Encounter (Signed)
Spoke with pt. Pt is having pain in her right side, at ribs x 2 days. + baby movement. Call transferred to front desk for appt. JSY

## 2013-07-14 ENCOUNTER — Ambulatory Visit (INDEPENDENT_AMBULATORY_CARE_PROVIDER_SITE_OTHER): Payer: Self-pay | Admitting: Obstetrics and Gynecology

## 2013-07-14 VITALS — BP 118/70 | Wt 172.0 lb

## 2013-07-14 DIAGNOSIS — Z331 Pregnant state, incidental: Secondary | ICD-10-CM

## 2013-07-14 DIAGNOSIS — Z1389 Encounter for screening for other disorder: Secondary | ICD-10-CM

## 2013-07-14 DIAGNOSIS — Z3400 Encounter for supervision of normal first pregnancy, unspecified trimester: Secondary | ICD-10-CM

## 2013-07-14 DIAGNOSIS — O0990 Supervision of high risk pregnancy, unspecified, unspecified trimester: Secondary | ICD-10-CM

## 2013-07-14 LAB — POCT URINALYSIS DIPSTICK
Blood, UA: NEGATIVE
Glucose, UA: NEGATIVE
KETONES UA: NEGATIVE
NITRITE UA: NEGATIVE
PROTEIN UA: NEGATIVE

## 2013-07-14 NOTE — Progress Notes (Signed)
[redacted]w[redacted]d G1P0 female presents for routine prenatal visit today. She denies vaginal bleeding or discharge, leg swelling.

## 2013-07-14 NOTE — Patient Instructions (Addendum)
Please check out [email or web address]   for more information on childbirth classes       Fetal Movement Counts Patient Name: __________________________________________________ Patient Due Date: ____________________ Performing a fetal movement count is highly recommended in high-risk pregnancies, but it is good for every pregnant woman to do. Your caregiver may ask you to start counting fetal movements at 28 weeks of the pregnancy. Fetal movements often increase:  After eating a full meal.  After physical activity.  After eating or drinking something sweet or cold.  At rest. Pay attention to when you feel the baby is most active. This will help you notice a pattern of your baby's sleep and wake cycles and what factors contribute to an increase in fetal movement. It is important to perform a fetal movement count at the same time each day when your baby is normally most active.  HOW TO COUNT FETAL MOVEMENTS 1. Find a quiet and comfortable area to sit or lie down on your left side. Lying on your left side provides the best blood and oxygen circulation to your baby. 2. Write down the day and time on a sheet of paper or in a journal. 3. Start counting kicks, flutters, swishes, rolls, or jabs in a 2 hour period. You should feel at least 10 movements within 2 hours. 4. If you do not feel 10 movements in 2 hours, wait 2 3 hours and count again. Look for a change in the pattern or not enough counts in 2 hours. SEEK MEDICAL CARE IF:  You feel less than 10 counts in 2 hours, tried twice.  There is no movement in over an hour.  The pattern is changing or taking longer each day to reach 10 counts in 2 hours.  You feel the baby is not moving as he or she usually does. Date: ____________ Movements: ____________ Start time: ____________ Doreatha MartinFinish time: ____________  Date: ____________ Movements: ____________ Start  time: ____________ Doreatha MartinFinish time: ____________ Date: ____________ Movements: ____________ Start time: ____________ Doreatha MartinFinish time: ____________ Date: ____________ Movements: ____________ Start time: ____________ Doreatha MartinFinish time: ____________ Date: ____________ Movements: ____________ Start time: ____________ Doreatha MartinFinish time: ____________ Date: ____________ Movements: ____________ Start time: ____________ Doreatha MartinFinish time: ____________ Date: ____________ Movements: ____________ Start time: ____________ Doreatha MartinFinish time: ____________ Date: ____________ Movements: ____________ Start time: ____________ Doreatha MartinFinish time: ____________  Date: ____________ Movements: ____________ Start time: ____________ Doreatha MartinFinish time: ____________ Date: ____________ Movements: ____________ Start time: ____________ Doreatha MartinFinish time: ____________ Date: ____________ Movements: ____________ Start time: ____________ Doreatha MartinFinish time: ____________ Date: ____________ Movements: ____________ Start time: ____________ Doreatha MartinFinish time: ____________ Date: ____________ Movements: ____________ Start time: ____________ Doreatha MartinFinish time: ____________ Date: ____________ Movements: ____________ Start time: ____________ Doreatha MartinFinish time: ____________ Date: ____________ Movements: ____________ Start time: ____________ Doreatha MartinFinish time: ____________  Date: ____________ Movements: ____________ Start time: ____________ Doreatha MartinFinish time: ____________ Date: ____________ Movements: ____________ Start time: ____________ Doreatha MartinFinish time: ____________ Date: ____________ Movements: ____________ Start time: ____________ Doreatha MartinFinish time: ____________ Date: ____________ Movements: ____________ Start time: ____________ Doreatha MartinFinish time: ____________ Date: ____________ Movements: ____________ Start time: ____________ Doreatha MartinFinish time: ____________ Date: ____________ Movements: ____________ Start time: ____________ Doreatha MartinFinish time: ____________ Date: ____________ Movements: ____________ Start time: ____________ Doreatha MartinFinish time: ____________   Date: ____________ Movements: ____________ Start time: ____________ Doreatha MartinFinish time: ____________ Date: ____________ Movements: ____________ Start time: ____________ Doreatha MartinFinish time: ____________ Date: ____________ Movements: ____________ Start time: ____________ Doreatha MartinFinish time: ____________ Date: ____________ Movements: ____________ Start time: ____________ Doreatha MartinFinish time: ____________ Date: ____________ Movements: ____________ Start time: ____________ Doreatha MartinFinish time: ____________ Date: ____________ Movements: ____________ Start time: ____________ Doreatha MartinFinish time: ____________ Date:  ____________ Movements: ____________ Start time: ____________ Doreatha Martin time: ____________  Date: ____________ Movements: ____________ Start time: ____________ Doreatha Martin time: ____________ Date: ____________ Movements: ____________ Start time: ____________ Doreatha Martin time: ____________ Date: ____________ Movements: ____________ Start time: ____________ Doreatha Martin time: ____________ Date: ____________ Movements: ____________ Start time: ____________ Doreatha Martin time: ____________ Date: ____________ Movements: ____________ Start time: ____________ Doreatha Martin time: ____________ Date: ____________ Movements: ____________ Start time: ____________ Doreatha Martin time: ____________ Date: ____________ Movements: ____________ Start time: ____________ Doreatha Martin time: ____________  Date: ____________ Movements: ____________ Start time: ____________ Doreatha Martin time: ____________ Date: ____________ Movements: ____________ Start time: ____________ Doreatha Martin time: ____________ Date: ____________ Movements: ____________ Start time: ____________ Doreatha Martin time: ____________ Date: ____________ Movements: ____________ Start time: ____________ Doreatha Martin time: ____________ Date: ____________ Movements: ____________ Start time: ____________ Doreatha Martin time: ____________ Date: ____________ Movements: ____________ Start time: ____________ Doreatha Martin time: ____________ Date: ____________ Movements: ____________  Start time: ____________ Doreatha Martin time: ____________  Date: ____________ Movements: ____________ Start time: ____________ Doreatha Martin time: ____________ Date: ____________ Movements: ____________ Start time: ____________ Doreatha Martin time: ____________ Date: ____________ Movements: ____________ Start time: ____________ Doreatha Martin time: ____________ Date: ____________ Movements: ____________ Start time: ____________ Doreatha Martin time: ____________ Date: ____________ Movements: ____________ Start time: ____________ Doreatha Martin time: ____________ Date: ____________ Movements: ____________ Start time: ____________ Doreatha Martin time: ____________ Date: ____________ Movements: ____________ Start time: ____________ Doreatha Martin time: ____________  Date: ____________ Movements: ____________ Start time: ____________ Doreatha Martin time: ____________ Date: ____________ Movements: ____________ Start time: ____________ Doreatha Martin time: ____________ Date: ____________ Movements: ____________ Start time: ____________ Doreatha Martin time: ____________ Date: ____________ Movements: ____________ Start time: ____________ Doreatha Martin time: ____________ Date: ____________ Movements: ____________ Start time: ____________ Doreatha Martin time: ____________ Date: ____________ Movements: ____________ Start time: ____________ Doreatha Martin time: ____________ Document Released: 02/26/2006 Document Revised: 01/14/2012 Document Reviewed: 11/24/2011 ExitCare Patient Information 2014 [employer].

## 2013-07-20 ENCOUNTER — Ambulatory Visit (INDEPENDENT_AMBULATORY_CARE_PROVIDER_SITE_OTHER): Payer: Medicaid Other | Admitting: Women's Health

## 2013-07-20 ENCOUNTER — Encounter: Payer: Self-pay | Admitting: Women's Health

## 2013-07-20 VITALS — BP 106/60 | Wt 170.0 lb

## 2013-07-20 DIAGNOSIS — Z1389 Encounter for screening for other disorder: Secondary | ICD-10-CM

## 2013-07-20 DIAGNOSIS — O09899 Supervision of other high risk pregnancies, unspecified trimester: Secondary | ICD-10-CM

## 2013-07-20 DIAGNOSIS — Z3400 Encounter for supervision of normal first pregnancy, unspecified trimester: Secondary | ICD-10-CM

## 2013-07-20 DIAGNOSIS — O0990 Supervision of high risk pregnancy, unspecified, unspecified trimester: Secondary | ICD-10-CM

## 2013-07-20 DIAGNOSIS — Z331 Pregnant state, incidental: Secondary | ICD-10-CM

## 2013-07-20 DIAGNOSIS — O26879 Cervical shortening, unspecified trimester: Secondary | ICD-10-CM

## 2013-07-20 LAB — POCT URINALYSIS DIPSTICK
Glucose, UA: NEGATIVE
Ketones, UA: NEGATIVE
NITRITE UA: NEGATIVE
PROTEIN UA: NEGATIVE
RBC UA: NEGATIVE

## 2013-07-20 LAB — OB RESULTS CONSOLE GC/CHLAMYDIA
CHLAMYDIA, DNA PROBE: NEGATIVE
Gonorrhea: NEGATIVE

## 2013-07-20 NOTE — Progress Notes (Signed)
High Risk Pregnancy Diagnosis(es): short cervix G1P0 [redacted]w[redacted]d Estimated Date of Delivery: 08/11/13 Blood pressure 106/60, weight 170 lb (77.111 kg), last menstrual period 11/04/2012.  Urinalysis: Negative HPI:  Doing well, some n/v/d yesterday- thinks she ate something bad- feels better today. Quit prometrium few weeks ago. BP, weight, and urine results all reviewed and noted.  Reports good fm. Denies regular uc's, lof, vb, uti s/s. Thinks she may be having some irregular uc's.   Fundal Height:  36  Fetal Heart rate:  147 Edema:  None GBS collected SVE: 5-6/80/-2, soft, vtx  Reviewed labor s/s, fkc. All questions were answered. Assessment: [redacted]w[redacted]d short cervix, advanced dilation, now one day from full-term Medication(s) Plans:  Stopped prometrium few weeks ago Treatment Plan:  Continue pnv, will be full-term tomorrow and no longer high-risk d/t short cx/advanced dilation at that time Follow up in 1wk for then low-risk OB appt

## 2013-07-20 NOTE — Patient Instructions (Signed)
[place] Pediatricians:  [facility] [phone]  (no MDs at present time)        [facility] [phone]                 [facility] [phone] (usually doesn't accept new patients unless you have family there already, you are always welcome to call and ask)             [facility] ([address] [place]) [phone]   [facility] Pediatricians:   [facility]: [phone]  [facility]: [phone]  Call the office [phone]) or go to [facility] if:  You begin to have strong, frequent contractions  Your water breaks.  Sometimes it is a big gush of fluid, sometimes it is just a trickle that keeps getting your panties wet or running down your legs  You have vaginal bleeding.  It is normal to have a small amount of spotting if your cervix was checked.   You don't feel your baby moving like normal.  If you don't, get you something to eat and drink and lay down and focus on feeling your baby move.  You should feel at least 10 movements in 2 hours.  If you don't, you should call the office or go to [facility].    [facility] Contractions Pregnancy is commonly associated with contractions of the uterus throughout the pregnancy. Towards the end of pregnancy (32 to 34 weeks), these contractions [facility] Willa Rough) can develop more often and may become more forceful. This is not true labor because these contractions do not result in opening (dilatation) and thinning of the cervix. They are sometimes difficult to tell apart from true labor because these contractions can be forceful and people have different pain tolerances. You should not feel embarrassed if you go to the hospital with false labor. Sometimes, the only way to tell if you are in true labor is for your caregiver to follow the changes in the cervix. How to tell the difference between true and false labor:  False  labor.  The contractions of false labor are usually shorter, irregular and not as hard as those of true labor.  They are often felt in the front of the lower abdomen and in the groin.  They may leave with walking around or changing positions while lying down.  They get weaker and are shorter lasting as time goes on.  These contractions are usually irregular.  They do not usually become progressively stronger, regular and closer together as with true labor.  True labor.  Contractions in true labor last 30 to 70 seconds, become very regular, usually become more intense, and increase in frequency.  They do not go away with walking.  The discomfort is usually felt in the top of the uterus and spreads to the lower abdomen and low back.  True labor can be determined by your caregiver with an exam. This will show that the cervix is dilating and getting thinner. If there are no prenatal problems or other health problems associated with the pregnancy, it is completely safe to be sent home with false labor and await the onset of true labor. HOME CARE INSTRUCTIONS   Keep up with your usual exercises and instructions.  Take medications as directed.  Keep your regular prenatal appointment.  Eat and drink lightly if you think you are going into labor.  If BH contractions are making you uncomfortable:  Change your activity position from  lying down or resting to walking/walking to resting.  Sit and rest in a tub of warm water.  Drink 2 to 3 glasses of water. Dehydration may cause B-H contractions.  Do slow and deep breathing several times an hour. SEEK IMMEDIATE MEDICAL CARE IF:   Your contractions continue to become stronger, more regular, and closer together.  You have a gushing, burst or leaking of fluid from the vagina.  An oral temperature above 102 F (38.9 C) develops.  You have passage of blood-tinged mucus.  You develop vaginal bleeding.  You develop continuous belly  (abdominal) pain.  You have low back pain that you never had before.  You feel the baby's head pushing down causing pelvic pressure.  The baby is not moving as much as it used to. Document Released: 01/27/2005 Document Revised: 04/21/2011 Document Reviewed: 11/08/2012 [facility] Patient Information 2014 [place], [place].

## 2013-07-21 LAB — GC/CHLAMYDIA PROBE AMP
CT Probe RNA: NEGATIVE
GC Probe RNA: NEGATIVE

## 2013-07-22 LAB — STREP B DNA PROBE: GBSP: NOT DETECTED

## 2013-07-24 ENCOUNTER — Inpatient Hospital Stay (HOSPITAL_COMMUNITY)
Admission: AD | Admit: 2013-07-24 | Discharge: 2013-07-27 | DRG: 775 | Disposition: A | Discharge status: Home / Self Care | Payer: Medicaid Other | Source: Ambulatory Visit | Attending: Family Medicine | Admitting: Family Medicine

## 2013-07-24 ENCOUNTER — Encounter (HOSPITAL_COMMUNITY): Payer: Self-pay | Admitting: Family

## 2013-07-24 ENCOUNTER — Inpatient Hospital Stay (HOSPITAL_COMMUNITY): Payer: Medicaid Other | Admitting: Anesthesiology

## 2013-07-24 ENCOUNTER — Encounter (HOSPITAL_COMMUNITY): Payer: Medicaid Other | Admitting: Anesthesiology

## 2013-07-24 DIAGNOSIS — Z8249 Family history of ischemic heart disease and other diseases of the circulatory system: Secondary | ICD-10-CM

## 2013-07-24 DIAGNOSIS — O358XX0 Maternal care for other (suspected) fetal abnormality and damage, not applicable or unspecified: Secondary | ICD-10-CM | POA: Diagnosis present

## 2013-07-24 DIAGNOSIS — O4290 Premature rupture of membranes, unspecified as to length of time between rupture and onset of labor, unspecified weeks of gestation: Secondary | ICD-10-CM

## 2013-07-24 DIAGNOSIS — O99334 Smoking (tobacco) complicating childbirth: Secondary | ICD-10-CM | POA: Diagnosis present

## 2013-07-24 DIAGNOSIS — O3430 Maternal care for cervical incompetence, unspecified trimester: Secondary | ICD-10-CM | POA: Diagnosis present

## 2013-07-24 DIAGNOSIS — Z833 Family history of diabetes mellitus: Secondary | ICD-10-CM

## 2013-07-24 LAB — CBC
HEMATOCRIT: 32.2 % — AB (ref 36.0–46.0)
Hemoglobin: 10.6 g/dL — ABNORMAL LOW (ref 12.0–15.0)
MCH: 26.1 pg (ref 26.0–34.0)
MCHC: 32.9 g/dL (ref 30.0–36.0)
MCV: 79.3 fL (ref 78.0–100.0)
Platelets: 300 10*3/uL (ref 150–400)
RBC: 4.06 MIL/uL (ref 3.87–5.11)
RDW: 14.1 % (ref 11.5–15.5)
WBC: 11.8 10*3/uL — AB (ref 4.0–10.5)

## 2013-07-24 LAB — RPR

## 2013-07-24 LAB — POCT FERN TEST: POCT Fern Test: POSITIVE

## 2013-07-24 MED ORDER — LACTATED RINGERS IV SOLN
500.0000 mL | Freq: Once | INTRAVENOUS | Status: AC
  Administered 2013-07-24: 500 mL via INTRAVENOUS

## 2013-07-24 MED ORDER — ACETAMINOPHEN 325 MG PO TABS: 650.0000 mg | ORAL_TABLET | ORAL | Status: DC | PRN

## 2013-07-24 MED ORDER — IBUPROFEN 600 MG PO TABS: 600.0000 mg | ORAL_TABLET | Freq: Four times a day (QID) | ORAL | Status: DC | PRN

## 2013-07-24 MED ORDER — FENTANYL 2.5 MCG/ML BUPIVACAINE 1/10 % EPIDURAL INFUSION (WH - ANES)
14.0000 mL/h | INTRAMUSCULAR | Status: DC | PRN
  Administered 2013-07-24: 14 mL/h via EPIDURAL
  Filled 2013-07-24: qty 125

## 2013-07-24 MED ORDER — FENTANYL CITRATE 0.05 MG/ML IJ SOLN
100.0000 ug | INTRAMUSCULAR | Status: DC | PRN
  Administered 2013-07-24: 100 ug via INTRAVENOUS
  Filled 2013-07-24: qty 2

## 2013-07-24 MED ORDER — LACTATED RINGERS IV SOLN
INTRAVENOUS | Status: DC
  Administered 2013-07-24 – 2013-07-25 (×2): via INTRAVENOUS

## 2013-07-24 MED ORDER — OXYTOCIN BOLUS FROM INFUSION
500.0000 mL | INTRAVENOUS | Status: DC
  Administered 2013-07-25: 500 mL via INTRAVENOUS

## 2013-07-24 MED ORDER — CITRIC ACID-SODIUM CITRATE 334-500 MG/5ML PO SOLN: 30.0000 mL | ORAL | Status: DC | PRN

## 2013-07-24 MED ORDER — DIPHENHYDRAMINE HCL 50 MG/ML IJ SOLN: 12.5000 mg | INTRAMUSCULAR | Status: DC | PRN

## 2013-07-24 MED ORDER — EPHEDRINE 5 MG/ML INJ
10.0000 mg | INTRAVENOUS | Status: DC | PRN
  Filled 2013-07-24: qty 4
  Filled 2013-07-24: qty 2

## 2013-07-24 MED ORDER — OXYCODONE-ACETAMINOPHEN 5-325 MG PO TABS: 1.0000 | ORAL_TABLET | ORAL | Status: DC | PRN

## 2013-07-24 MED ORDER — FENTANYL 2.5 MCG/ML BUPIVACAINE 1/10 % EPIDURAL INFUSION (WH - ANES)
INTRAMUSCULAR | Status: DC | PRN
  Administered 2013-07-24: 14 mL/h via EPIDURAL

## 2013-07-24 MED ORDER — LIDOCAINE HCL (PF) 1 % IJ SOLN
30.0000 mL | INTRAMUSCULAR | Status: DC | PRN
  Filled 2013-07-24: qty 30

## 2013-07-24 MED ORDER — PHENYLEPHRINE 40 MCG/ML (10ML) SYRINGE FOR IV PUSH (FOR BLOOD PRESSURE SUPPORT)
80.0000 ug | PREFILLED_SYRINGE | INTRAVENOUS | Status: DC | PRN
  Filled 2013-07-24: qty 2

## 2013-07-24 MED ORDER — ONDANSETRON HCL 4 MG/2ML IJ SOLN
4.0000 mg | Freq: Four times a day (QID) | INTRAMUSCULAR | Status: DC | PRN
  Administered 2013-07-24: 4 mg via INTRAVENOUS
  Filled 2013-07-24: qty 2

## 2013-07-24 MED ORDER — LACTATED RINGERS IV SOLN: 500.0000 mL | INTRAVENOUS | Status: DC | PRN

## 2013-07-24 MED ORDER — EPHEDRINE 5 MG/ML INJ
10.0000 mg | INTRAVENOUS | Status: DC | PRN
  Filled 2013-07-24: qty 2

## 2013-07-24 MED ORDER — OXYTOCIN 40 UNITS IN LACTATED RINGERS INFUSION - SIMPLE MED
62.5000 mL/h | INTRAVENOUS | Status: DC
Start: 2013-07-24 — End: 2013-07-25
  Filled 2013-07-24: qty 1000

## 2013-07-24 MED ORDER — PHENYLEPHRINE 40 MCG/ML (10ML) SYRINGE FOR IV PUSH (FOR BLOOD PRESSURE SUPPORT)
80.0000 ug | PREFILLED_SYRINGE | INTRAVENOUS | Status: DC | PRN
  Filled 2013-07-24: qty 2
  Filled 2013-07-24: qty 10

## 2013-07-24 MED ORDER — LIDOCAINE HCL (PF) 1 % IJ SOLN
INTRAMUSCULAR | Status: DC | PRN
  Administered 2013-07-24 (×3): 5 mL

## 2013-07-24 NOTE — H&P (Signed)
HPI: Marie Rivera is a 23 y.o. year old G1P0 female at 1348w3d weeks gestation by LMP and 18.5 weeks US who presents to [facility] reporting Spontaneous rupture of membranes at 1400 and mild contractions.  Patient Active Problem List   Diagnosis Date Noted  . Anemia complicating pregnancy 05/11/2013  . Fetal hydronephrosis in pregnancy, antepartum condition 05/10/2013  . Late prenatal care complicating pregnancy in second trimester 04/26/2013  . Cervical incompetence in pregnancy in second trimester 04/18/2013  . Supervision of high-risk pregnancy 04/04/2013   [facility]  FOB Ron coles 23 yo bm, 2nd baby  Pap Normal 04/04/13  GC/CT Initial:   Negative 04/04/13             36+wks:  Genetic Screen NT/IT:    AFPnegative  CF screen negative  Anatomic US Female, mild bilateral hydronephrosis, normal bladder vol  Flu vaccine declined  Tdap Recommended ~ 28wks  Glucose Screen  2 hr normal: 84/125/95  GBS Neg  Feed Preference Breast/bottle  Contraception Nexplanon  Circumcision Yes  Childbirth Classes Recommended, info given, didn't make it  Pediatrician Undecided, info given   Maternal Medical History:  Reason for admission: Nausea.    OB History   Grav Para Term Preterm Abortions TAB SAB Ect Mult Living   1              Past Medical History  Diagnosis Date  . Supervision of normal pregnancy in second trimester 04/04/2013   History reviewed. No pertinent past surgical history. Family History: family history includes Cancer in her maternal aunt; Diabetes in her maternal grandmother and mother; Hypertension in her mother. Social History:  reports that she has been smoking Cigarettes.  She has a .04 pack-year smoking history. She has never used smokeless tobacco. She reports that she does not drink alcohol or use illicit drugs.   Prenatal Transfer Tool  Maternal Diabetes: No Genetic Screening: Normal Maternal Ultrasounds/Referrals: Abnormal:  Findings:   Fetal renal  pyelectasis Fetal Ultrasounds or other Referrals:  None Maternal Substance Abuse:  No Significant Maternal Medications:  Meds include: Progesterone Significant Maternal Lab Results:  Lab values include: Group B Strep negative Other Comments:  None  Review of Systems  Constitutional: Negative for fever and chills.  Eyes: Negative for blurred vision and double vision.  Gastrointestinal: Negative for nausea, vomiting and abdominal pain.  Neurological: Negative for headaches.    Dilation: 5.5 (leaking bag) Effacement (%): 90 Station: Ballotable Exam by:: G. Morris Blood pressure 121/73, pulse 97, temperature 98.2 F (36.8 C), temperature source Oral, resp. rate 14, height 5\' 2"  (1.575 m), weight 77.111 kg (170 lb), last menstrual period 11/04/2012. Maternal Exam:  Uterine Assessment: Contraction strength is mild.  Contraction frequency is irregular.   Abdomen: Estimated fetal weight is 7 lb.   Fetal presentation: vertex  Introitus: Normal vulva. Normal vagina.  Ferning test: positive.  Amniotic fluid character: clear.  Pelvis: adequate for delivery.   Cervix: Cervix evaluated by sterile speculum exam and digital exam.     Fetal Exam Fetal Monitor Review: Mode: ultrasound.   Baseline rate: 150.  Variability: moderate (6-25 bpm).   Pattern: no decelerations and accelerations present.    Fetal State Assessment: Category I - tracings are normal.     Physical Exam  Nursing note and vitals reviewed. Constitutional: She is oriented to person, place, and time. She appears well-developed and well-nourished. No distress.  HENT:  Head: Normocephalic.  Eyes: Conjunctivae are normal.  Cardiovascular: Normal rate, regular  rhythm and normal heart sounds.   Respiratory: Effort normal and breath sounds normal.  GI: Soft. There is no tenderness.  Genitourinary: Vagina normal and uterus normal.  Pool positive  Musculoskeletal: Normal range of motion. She exhibits no edema and no  tenderness.  Neurological: She is alert and oriented to person, place, and time. She has normal reflexes.  Skin: Skin is warm and dry.  Psychiatric: She has a normal mood and affect.    Prenatal labs: ABO, Rh: A/POS/-- (02/23 1545) Antibody: NEG (03/31 0900) Rubella: 1.42 (02/23 1545) RPR: NON REAC (03/31 0900)  HBsAg: NEGATIVE (02/23 1545)  HIV: NON REACTIVE (03/31 0900)  GBS: NOT DETECTED (06/10 1545)  Quad screen neg  Assessment: 1. Labor: Early, SROM 2. Fetal Wellbeing: Category I  3. Pain Control: None 4. GBS: Neg 5. 37.3 week IUP  Plan:  1. Admit to BS per consult with MD 2. Routine L&D orders 3. Analgesia/anesthesia PRN  4. May need pitocin augmentation  Dorathy KinsmanSMITH, Rohit Deloria 07/24/2013, 7:32 PM

## 2013-07-24 NOTE — Anesthesia Preprocedure Evaluation (Addendum)
Anesthesia Evaluation  Patient identified by MRN, date of birth, ID band Patient awake    Reviewed: Allergy & Precautions, H&P , NPO status , Patient's Chart, lab work & pertinent test results  History of Anesthesia Complications Negative for: history of anesthetic complications  Airway Mallampati: II TM Distance: >3 FB Neck ROM: full    Dental no notable dental hx. (+) Teeth Intact   Pulmonary neg pulmonary ROS, former smoker,  breath sounds clear to auscultation  Pulmonary exam normal       Cardiovascular negative cardio ROS  Rhythm:regular Rate:Normal     Neuro/Psych negative neurological ROS  negative psych ROS   GI/Hepatic negative GI ROS, Neg liver ROS,   Endo/Other  negative endocrine ROS  Renal/GU negative Renal ROS  negative genitourinary   Musculoskeletal   Abdominal Normal abdominal exam  (+)   Peds  Hematology negative hematology ROS (+)   Anesthesia Other Findings   Reproductive/Obstetrics (+) Pregnancy                           Anesthesia Physical Anesthesia Plan  ASA: II  Anesthesia Plan: Epidural   Post-op Pain Management:    Induction:   Airway Management Planned:   Additional Equipment:   Intra-op Plan:   Post-operative Plan:   Informed Consent: I have reviewed the patients History and Physical, chart, labs and discussed the procedure including the risks, benefits and alternatives for the proposed anesthesia with the patient or authorized representative who has indicated his/her understanding and acceptance.     Plan Discussed with:   Anesthesia Plan Comments:         Anesthesia Quick Evaluation  

## 2013-07-24 NOTE — Anesthesia Procedure Notes (Signed)
Epidural Patient location during procedure: OB  Staffing Anesthesiologist: Phillips GroutARIGNAN, Javeon Macmurray Performed by: anesthesiologist   Preanesthetic Checklist Completed: patient identified, site marked, surgical consent, pre-op evaluation, timeout performed, IV checked, risks and benefits discussed and monitors and equipment checked  Epidural Patient position: sitting Prep: ChloraPrep Patient monitoring: heart rate, continuous pulse ox and blood pressure Approach: midline Location: L4-L5 Injection technique: LOR saline  Needle:  Needle type: Tuohy  Needle gauge: 17 G Needle length: 9 cm and 9 Needle insertion depth: 5 cm Catheter type: closed end flexible Catheter size: 20 Guage Catheter at skin depth: 10 cm Test dose: negative  Assessment Events: blood not aspirated, injection not painful, no injection resistance, negative IV test and no paresthesia  Additional Notes First attempt at L3-4 resulted in intravenous catheter. Frank aspiration of blood recognized immediately. Cath removed and replaced at L4-5 with success.  Patient tolerated the insertion well without complications.

## 2013-07-25 ENCOUNTER — Encounter: Payer: Self-pay | Admitting: Women's Health

## 2013-07-25 ENCOUNTER — Encounter (HOSPITAL_COMMUNITY): Payer: Self-pay

## 2013-07-25 DIAGNOSIS — O99334 Smoking (tobacco) complicating childbirth: Secondary | ICD-10-CM

## 2013-07-25 DIAGNOSIS — O358XX0 Maternal care for other (suspected) fetal abnormality and damage, not applicable or unspecified: Secondary | ICD-10-CM

## 2013-07-25 DIAGNOSIS — O3430 Maternal care for cervical incompetence, unspecified trimester: Secondary | ICD-10-CM

## 2013-07-25 MED ORDER — DIBUCAINE 1 % RE OINT: 1.0000 "application " | TOPICAL_OINTMENT | RECTAL | Status: DC | PRN

## 2013-07-25 MED ORDER — TETANUS-DIPHTH-ACELL PERTUSSIS 5-2.5-18.5 LF-MCG/0.5 IM SUSP: 0.5000 mL | Freq: Once | INTRAMUSCULAR | Status: DC

## 2013-07-25 MED ORDER — IBUPROFEN 600 MG PO TABS
600.0000 mg | ORAL_TABLET | Freq: Four times a day (QID) | ORAL | Status: DC
  Administered 2013-07-25 – 2013-07-27 (×8): 600 mg via ORAL
  Filled 2013-07-25 (×8): qty 1

## 2013-07-25 MED ORDER — SIMETHICONE 80 MG PO CHEW: 80.0000 mg | CHEWABLE_TABLET | ORAL | Status: DC | PRN

## 2013-07-25 MED ORDER — DIPHENHYDRAMINE HCL 25 MG PO CAPS: 25.0000 mg | ORAL_CAPSULE | Freq: Four times a day (QID) | ORAL | Status: DC | PRN

## 2013-07-25 MED ORDER — ONDANSETRON HCL 4 MG/2ML IJ SOLN: 4.0000 mg | INTRAMUSCULAR | Status: DC | PRN

## 2013-07-25 MED ORDER — PRENATAL MULTIVITAMIN CH
1.0000 | ORAL_TABLET | Freq: Every day | ORAL | Status: DC
  Administered 2013-07-25 – 2013-07-26 (×2): 1 via ORAL
  Filled 2013-07-25 (×2): qty 1

## 2013-07-25 MED ORDER — WITCH HAZEL-GLYCERIN EX PADS: 1.0000 "application " | MEDICATED_PAD | CUTANEOUS | Status: DC | PRN

## 2013-07-25 MED ORDER — SENNOSIDES-DOCUSATE SODIUM 8.6-50 MG PO TABS
2.0000 | ORAL_TABLET | ORAL | Status: DC
  Administered 2013-07-25 – 2013-07-27 (×2): 2 via ORAL
  Filled 2013-07-25 (×2): qty 2

## 2013-07-25 MED ORDER — ONDANSETRON HCL 4 MG PO TABS: 4.0000 mg | ORAL_TABLET | ORAL | Status: DC | PRN

## 2013-07-25 MED ORDER — BENZOCAINE-MENTHOL 20-0.5 % EX AERO
1.0000 "application " | INHALATION_SPRAY | CUTANEOUS | Status: DC | PRN
  Filled 2013-07-25: qty 56

## 2013-07-25 MED ORDER — ZOLPIDEM TARTRATE 5 MG PO TABS: 5.0000 mg | ORAL_TABLET | Freq: Every evening | ORAL | Status: DC | PRN

## 2013-07-25 MED ORDER — OXYCODONE-ACETAMINOPHEN 5-325 MG PO TABS
1.0000 | ORAL_TABLET | ORAL | Status: DC | PRN
  Administered 2013-07-25: 1 via ORAL
  Filled 2013-07-25: qty 1

## 2013-07-25 MED ORDER — LANOLIN HYDROUS EX OINT: TOPICAL_OINTMENT | CUTANEOUS | Status: DC | PRN

## 2013-07-25 NOTE — Progress Notes (Signed)
Marie Rivera is a 23 y.o. G1P0 at 30102w4d by LMP admitted for rupture of membranes  Subjective: Pt breathing with contractions.  Family in room for support.  Objective: BP 104/58  Pulse 72  Temp(Src) 98.2 F (36.8 C) (Oral)  Resp 16  Ht 5\' 2"  (1.575 m)  Wt 77.111 kg (170 lb)  BMI 31.09 kg/m2  SpO2 99%  LMP 11/04/2012      FHT:  FHR: 135 bpm, variability: moderate,  accelerations:  Present,  decelerations:  Absent UC:   regular, every 4-5 minutes SVE:   Dilation: 6-7 Effacement (%): 80 Station: -1 Exam by:: Marie NielsenNatalie Alexander, MD  Labs: Lab Results  Component Value Date   WBC 11.8* 07/24/2013   HGB 10.6* 07/24/2013   HCT 32.2* 07/24/2013   MCV 79.3 07/24/2013   PLT 300 07/24/2013    Assessment / Plan: Spontaneous labor, progressing normally  Labor: Progressing normally Preeclampsia:  n/a Fetal Wellbeing:  Category I Pain Control:  Fentanyl I/D:  n/a Anticipated MOD:  NSVD  Marie Rivera, Marie Rivera 07/25/2013, 1:25 AM

## 2013-07-25 NOTE — H&P (Signed)
Attestation of Attending Supervision of Advanced Practitioner (PA/CNM/NP): Evaluation and management procedures were performed by the Advanced Practitioner under my supervision and collaboration.  I have reviewed the Advanced Practitioner's note and chart, and I agree with the management and plan.  Reva BoresPRATT,Welles Walthall S, MD [facility] Attending 07/25/2013 5:18 AM

## 2013-07-25 NOTE — Progress Notes (Signed)
Marie Rivera is a 23 y.o. G1P0 at 5812w4d by LMP admitted for rupture of membranes  Subjective: Pt comfortable with epidural.  Feeling some mild rectal pressure intermittently.  Family at bedside for support.   Objective: BP 104/58  Pulse 72  Temp(Src) 98.2 F (36.8 C) (Oral)  Resp 16  Ht 5\' 2"  (1.575 m)  Wt 77.111 kg (170 lb)  BMI 31.09 kg/m2  SpO2 99%  LMP 11/04/2012      FHT:  FHR: 135 bpm, variability: moderate,  accelerations:  Present,  decelerations:  Absent UC:   regular, every 3 minutes SVE:   Dilation: 10 Effacement (%): 100 Station: 0 Exam by:: L.Leftwich, CNM AROM of forebag, large amount of clear fluid noted  Labs: Lab Results  Component Value Date   WBC 11.8* 07/24/2013   HGB 10.6* 07/24/2013   HCT 32.2* 07/24/2013   MCV 79.3 07/24/2013   PLT 300 07/24/2013    Assessment / Plan: Spontaneous labor, progressing normally  Labor: Progressing normally.  Plan to labor down at this time Preeclampsia:  n/a Fetal Wellbeing:  Category I Pain Control:  Epidural I/D:  n/a Anticipated MOD:  NSVD  [name], Remmy Crass 07/25/2013, 1:22 AM

## 2013-07-25 NOTE — Anesthesia Postprocedure Evaluation (Signed)
  Anesthesia Post-op Note  Patient: Marie Rivera  Procedure(s) Performed: * No procedures listed *  Patient Location: Mother/Baby  Anesthesia Type:Epidural  Level of Consciousness: awake, alert  and oriented  Airway and Oxygen Therapy: Patient Spontanous Breathing  Post-op Pain: none  Post-op Assessment: Post-op Vital signs reviewed, Patient's Cardiovascular Status Stable, Respiratory Function Stable, No headache, No backache, No residual numbness and No residual motor weakness  Post-op Vital Signs: Reviewed and stable  Last Vitals:  Filed Vitals:   07/25/13 1145  BP: 110/62  Pulse: 88  Temp: 36.8 C  Resp: 20    Complications: No apparent anesthesia complications

## 2013-07-25 NOTE — Lactation Note (Signed)
This note was copied from the chart of Marie Charmian Nicastro. Lactation Consultation Note  Initial consult:  Reviewed hand expression.  Good drops of colostrum expressed. Mother has large nipples.  Baby recently breastfed and mother states baby opens wide. Reviewed how to achieve a deep latch, and basics. Mom made aware of O/P services, breastfeeding support groups, community resources, and our phone # for post-discharge questions.  Vernona RiegerLaura RN has viewed latch. Encouraged mother to call if further assistance is needed.   Patient Name: Marie Theodis BlazeShacree Arizola ZOXWR'UToday's Date: 07/25/2013 Reason for consult: Follow-up assessment   Maternal Data    Feeding Feeding Type: Breast Fed Length of feed: 15 min  LATCH Score/Interventions Latch: Grasps breast easily, tongue down, lips flanged, rhythmical sucking.  Audible Swallowing: Spontaneous and intermittent  Type of Nipple: Everted at rest and after stimulation  Comfort (Breast/Nipple): Soft / non-tender     Hold (Positioning): Assistance needed to correctly position infant at breast and maintain latch. Intervention(s): Support Pillows;Position options  LATCH Score: 9  Lactation Tools Discussed/Used     Consult Status Consult Status: Follow-up Date: 07/26/13 Follow-up type: In-patient    Dahlia ByesBerkelhammer, Ruth [facility]os[facility] 07/25/2013, 10:48 AM

## 2013-07-26 NOTE — Progress Notes (Signed)
Post Partum Day 1 Subjective: no complaints, up ad lib, voiding and tolerating PO.  Infant is on bili lights  Objective: Blood pressure 97/61, pulse 67, temperature 98.7 F (37.1 C), temperature source Oral, resp. rate 16, height 5\' 2"  (1.575 m), weight 170 lb (77.111 kg), last menstrual period 11/04/2012, SpO2 98.00%, unknown if currently breastfeeding.  Physical Exam:  General: alert and no distress Lochia: appropriate Uterine Fundus: firm Incision: n/a DVT Evaluation: No evidence of DVT seen on physical exam.   Recent Labs  07/24/13 1955  HGB 10.6*  HCT 32.2*    Assessment/Plan: Plan for discharge tomorrow and Contraception Mirena   LOS: 2 days   HARRAWAY-SMITH, CAROLYN 07/26/2013, 7:58 AM

## 2013-07-26 NOTE — Lactation Note (Signed)
This note was copied from the chart of Marie Angellica Clowers. Lactation Consultation Note   Follow up visit at 37 hours of age.  Mom is on the phone but declines for me to come back later and puts phone down for a short conversation.  Mom reports baby is doing well eating a lot with several voids and stools (5voids and 6 stools charted).  Mom denies any concerns at this time.  Encouraged mom to call [facility] RN for Latch assessment with feeding tonight.    Patient Name: Marie Theodis BlazeShacree Campa ZOXWR'UToday's Date: 07/26/2013 Reason for consult: Follow-up assessment   Maternal Data    Feeding Feeding Type: Breast Fed Length of feed: 30 min  LATCH Score/Interventions                      Lactation Tools Discussed/Used     Consult Status Consult Status: Follow-up Date: 07/27/13 Follow-up type: In-patient    Shoptaw, Arvella MerlesJana Lynn 07/26/2013, 5:27 PM

## 2013-07-27 ENCOUNTER — Encounter: Payer: Medicaid Other | Admitting: Women's Health

## 2013-07-27 MED ORDER — IBUPROFEN 600 MG PO TABS: 600.0000 mg | ORAL_TABLET | Freq: Four times a day (QID) | ORAL | Status: DC

## 2013-07-27 NOTE — Discharge Instructions (Signed)

## 2013-07-27 NOTE — Discharge Summary (Signed)
Obstetric Discharge Summary Reason for Admission: onset of labor Prenatal Procedures: ultrasound Intrapartum Procedures: spontaneous vaginal delivery Postpartum Procedures: none Complications-Operative and Postpartum: 1st  degree perineal laceration Hemoglobin  Date Value Ref Range Status  07/24/2013 10.6* 12.0 - 15.0 g/dL Final     HCT  Date Value Ref Range Status  07/24/2013 32.2* 36.0 - 46.0 % Final   Marie Rivera is a 23 y.o. year old G1P0 female at 316w3d weeks gestation presenting with onset of labor.  At 3:52 AM (6/15) a viable and healthy female was delivered via Vaginal, Spontaneous Delivery (Presentation: Right Occiput Anterior). APGAR: 9, 9. She is breast feeding and wanting the nexplanon for contraception.   Physical Exam:  General: alert, cooperative and no distress Lochia: appropriate Uterine Fundus: firm Incision: n/a DVT Evaluation: No evidence of DVT seen on physical exam.  Discharge Diagnoses: Term Pregnancy-delivered  Discharge Information: Date: 07/27/2013 Activity: unrestricted Diet: routine Medications: Ibuprofen Condition: stable Instructions: refer to practice specific booklet Discharge to: home Follow-up Information   Follow up with [facility]. Schedule an appointment as soon as possible for a visit in 5 weeks. (postpartum follow up)    Contact information:   [address] [place] [id number] [phone]      Newborn Data: Live born female  Birth Weight: 7 lb 0.3 oz (3184 g) APGAR: 9, 9  Home with mother.  Marie RudeSchmitz, Marie E 07/27/2013, 7:54 AM  I spoke with and examined patient and agree with resident's note and plan of care.  Marie ScaleMichael Ryan Odom, MD OB Fellow 07/27/2013 9:18 AM

## 2013-07-27 NOTE — Lactation Note (Signed)
This note was copied from the chart of Marie Shantella Fallaw. Lactation Consultation Note  Follow up consult:  Mother states it hurts at first when she breastfeeds but resolves. Reviewed deep latch, provided comfort gels and reviewed using ebm for healing. Provided mother with hand pump, reviewed cluster feeding, pacifier use and engorement care. Mother's breasts are filling. Encouraged her to call for further assistance.   Patient Name: Marie Rivera ZOXWR'UToday's Date: 07/27/2013 Reason for consult: Follow-up assessment   Maternal Data    Feeding    LATCH Score/Interventions                      Lactation Tools Discussed/Used     Consult Status Consult Status: Complete    Hardie PulleyBerkelhammer, Ruth Boschen 07/27/2013, 11:23 AM

## 2013-08-24 ENCOUNTER — Ambulatory Visit (INDEPENDENT_AMBULATORY_CARE_PROVIDER_SITE_OTHER): Payer: Medicaid Other | Admitting: Advanced Practice Midwife

## 2013-08-24 ENCOUNTER — Encounter: Payer: Self-pay | Admitting: Advanced Practice Midwife

## 2013-08-24 NOTE — Progress Notes (Signed)
  Marie Rivera is a 23 y.o. who presents for a postpartum visit. She is 4 weeks postpartum following a spontaneous vaginal delivery. I have fully reviewed the prenatal and intrapartum course. The delivery was at 37.4 gestational weeks.  Anesthesia: epidural. Postpartum course has been uneventful. Baby's course has been uneventful. Baby is feeding by breast milk by pump. Bleeding: no bleeding. Bowel function is normal. Bladder function is normal. Patient is not sexually active. Contraception method is none. Postpartum depression screening: negative.    Review of Systems   Constitutional: Negative for fever and chills Eyes: Negative for visual disturbances Respiratory: Negative for shortness of breath, dyspnea Cardiovascular: Negative for chest pain or palpitations  Gastrointestinal: Negative for vomiting, diarrhea and constipation Genitourinary: Negative for dysuria and urgency Musculoskeletal: Negative for back pain, joint pain, myalgias  Neurological: Negative for dizziness and headaches   Objective:     Filed Vitals:   08/24/13 1323  BP: 100/68   General:  alert, cooperative and no distress   Breasts:  negative  Lungs: clear to auscultation bilaterally  Heart:  regular rate and rhythm  Abdomen: Soft, nontender   Vulva:  normal  Vagina: normal vagina  Cervix:  closed  Corpus: Well involuted     Rectal Exam: no hemorrhoids        Assessment:    normal postpartum exam.  Plan:    1. Contraception: Nexplanon 2. Follow up in: 3 weeks for NExplanon (abstinence until then) or as needed.

## 2013-09-02 ENCOUNTER — Telehealth: Payer: Self-pay | Admitting: Obstetrics and Gynecology

## 2013-09-02 NOTE — Telephone Encounter (Signed)
Spoke with pt. Pt delivered in June. She is having some anal pain x few weeks. Unsure if it's hemorrhoids. I advised would need to be seen to have this addressed. Pt voiced understanding. Call transferred to front desk for appt. JSY

## 2013-09-06 ENCOUNTER — Ambulatory Visit: Payer: Medicaid Other | Admitting: Women's Health

## 2013-09-14 ENCOUNTER — Encounter: Payer: Self-pay | Admitting: Advanced Practice Midwife

## 2013-09-14 ENCOUNTER — Other Ambulatory Visit: Payer: Medicaid Other

## 2013-09-14 ENCOUNTER — Ambulatory Visit (INDEPENDENT_AMBULATORY_CARE_PROVIDER_SITE_OTHER): Payer: Medicaid Other | Admitting: Advanced Practice Midwife

## 2013-09-14 VITALS — BP 112/60 | Ht 62.0 in | Wt 151.0 lb

## 2013-09-14 DIAGNOSIS — Z30017 Encounter for initial prescription of implantable subdermal contraceptive: Secondary | ICD-10-CM

## 2013-09-14 DIAGNOSIS — Z3200 Encounter for pregnancy test, result unknown: Secondary | ICD-10-CM

## 2013-09-14 DIAGNOSIS — Z3202 Encounter for pregnancy test, result negative: Secondary | ICD-10-CM

## 2013-09-14 LAB — POCT URINE PREGNANCY: Preg Test, Ur: NEGATIVE

## 2013-09-14 LAB — HCG, QUANTITATIVE, PREGNANCY

## 2013-09-14 NOTE — Progress Notes (Signed)
  HPI:  Marie Rivera is a 23 y.o. year old African American female here for Nexplanon insertion.  Her LMP was 7/27 , and her pregnancy test today was negative.  Risks/benefits/side effects of Nexplanon have been discussed and her questions have been answered.  Specifically, a failure rate of 02/998 has been reported, with an increased failure rate if pt takes St. John's Wort and/or antiseizure medicaitons.  Carlyle E Finnell is aware of the common side effect of irregular bleeding, which the incidence of decreases over time.   Past Medical History: Past Medical History  Diagnosis Date  . Supervision of normal pregnancy in second trimester 04/04/2013    Past Surgical History: History reviewed. No pertinent past surgical history.  Family History: Family History  Problem Relation Age of Onset  . Diabetes Mother   . Hypertension Mother   . Cancer Maternal Aunt   . Diabetes Maternal Grandmother     Social History: History  Substance Use Topics  . Smoking status: Current Some Day Smoker -- 0.02 packs/day for 2 years    Types: Cigarettes  . Smokeless tobacco: Never Used  . Alcohol Use: No    Allergies: No Known Allergies    Her right arm, approximatly 4 inches proximal from the elbow, was cleansed with alcohol and anesthetized with 2cc of 2% Lidocaine.  The area was cleansed again and the Nexplanon was inserted without difficulty.  A pressure bandage was applied.  Pt was instructed to remove pressure bandage in a few hours, and keep insertion site covered with a bandaid for 3 days.  Back up contraception was recommended for 2 weeks.  Follow-up scheduled PRN problems  CRESENZO-DISHMAN,Starlina Lapre 09/14/2013 2:02 PM

## 2013-09-14 NOTE — Addendum Note (Signed)
Addended by: Debby BudLONG, Cherylene Ferrufino R on: 09/14/2013 02:56 PM   Modules accepted: Orders

## 2013-12-12 ENCOUNTER — Encounter: Payer: Self-pay | Admitting: Advanced Practice Midwife

## 2015-02-02 ENCOUNTER — Emergency Department (HOSPITAL_COMMUNITY)
Admission: EM | Admit: 2015-02-02 | Discharge: 2015-02-02 | Disposition: A | Discharge status: Home / Self Care | Payer: Medicaid Other | Attending: Emergency Medicine | Admitting: Emergency Medicine

## 2015-02-02 ENCOUNTER — Encounter (HOSPITAL_COMMUNITY): Payer: Self-pay | Admitting: Emergency Medicine

## 2015-02-02 DIAGNOSIS — Z3202 Encounter for pregnancy test, result negative: Secondary | ICD-10-CM | POA: Insufficient documentation

## 2015-02-02 DIAGNOSIS — F17210 Nicotine dependence, cigarettes, uncomplicated: Secondary | ICD-10-CM | POA: Insufficient documentation

## 2015-02-02 DIAGNOSIS — N760 Acute vaginitis: Secondary | ICD-10-CM | POA: Insufficient documentation

## 2015-02-02 LAB — WET PREP, GENITAL
Clue Cells Wet Prep HPF POC: NONE SEEN
SPERM: NONE SEEN
Trich, Wet Prep: NONE SEEN
YEAST WET PREP: NONE SEEN

## 2015-02-02 LAB — URINE MICROSCOPIC-ADD ON

## 2015-02-02 LAB — URINALYSIS, ROUTINE W REFLEX MICROSCOPIC
Bilirubin Urine: NEGATIVE
GLUCOSE, UA: NEGATIVE mg/dL
KETONES UR: NEGATIVE mg/dL
Nitrite: NEGATIVE
PH: 7 (ref 5.0–8.0)
SPECIFIC GRAVITY, URINE: 1.02 (ref 1.005–1.030)

## 2015-02-02 LAB — POC URINE PREG, ED: Preg Test, Ur: NEGATIVE

## 2015-02-02 MED ORDER — LIDOCAINE HCL (PF) 1 % IJ SOLN
INTRAMUSCULAR | Status: AC
  Filled 2015-02-02: qty 5

## 2015-02-02 MED ORDER — AZITHROMYCIN 250 MG PO TABS
1000.0000 mg | ORAL_TABLET | Freq: Once | ORAL | Status: AC
  Administered 2015-02-02: 1000 mg via ORAL
  Filled 2015-02-02: qty 4

## 2015-02-02 MED ORDER — CEFTRIAXONE SODIUM 250 MG IJ SOLR
250.0000 mg | Freq: Once | INTRAMUSCULAR | Status: AC
  Administered 2015-02-02: 250 mg via INTRAMUSCULAR
  Filled 2015-02-02: qty 250

## 2015-02-02 NOTE — ED Notes (Signed)
Pt reports yellow, foul smelling vaginal discharge for 2 weeks. Denies any pain/itching. Denies urinary symptoms.

## 2015-02-02 NOTE — Discharge Instructions (Signed)
Vaginitis Vaginitis is an inflammation of the vagina. It can happen when the normal bacteria and yeast in the vagina grow too much. There are different types. Treatment will depend on the type you have. HOME CARE  Take all medicines as told by your doctor.  Keep your vagina area clean and dry. Avoid soap. Rinse the area with water.  Avoid washing and cleaning out the vagina (douching).  Do not use tampons or have sex (intercourse) until your treatment is done.  Wipe from front to back after going to the restroom.  Wear cotton underwear.  Avoid wearing underwear while you sleep until your vaginitis is gone.  Avoid tight pants. Avoid underwear or nylons without a cotton panel.  Take off wet clothing (such as a bathing suit) as soon as you can.  Use mild, unscented products. Avoid fabric softeners and scented:  Feminine sprays.  Laundry detergents.  Tampons.  Soaps or bubble baths.  Practice safe sex and use condoms. GET HELP RIGHT AWAY IF:   You have belly (abdominal) pain.  You have a fever or lasting symptoms for more than 2-3 days.  You have a fever and your symptoms suddenly get worse. MAKE SURE YOU:   Understand these instructions.  Will watch this condition.  Will get help right away if you are not doing well or get worse.   This information is not intended to replace advice given to you by your health care provider. Make sure you discuss any questions you have with your health care provider.   Document Released: 04/25/2008 Document Revised: 10/22/2011 Document Reviewed: 07/10/2011 Elsevier Interactive Patient Education 2016 Elsevier Inc.  

## 2015-02-04 LAB — RPR: RPR: NONREACTIVE

## 2015-02-05 LAB — URINE CULTURE

## 2015-02-06 NOTE — ED Provider Notes (Signed)
CSN: [id number]     Arrival date & time 02/02/15  1713 History   First MD Initiated Contact with Patient 02/02/15 1907     Chief Complaint  Patient presents with  . Vaginal Discharge     (Consider location/radiation/quality/duration/timing/severity/associated sxs/prior Treatment) HPI   Marie Rivera is a 24 y.o. female who presents to the Emergency Department complaining of yellow, foul smelling vaginal discharge for 2 weeks.  She states the discharge is heavy at times and has a persistent smell.  She denies any new sexual partners, fever, chills, pelvic or abdominal pain.  She has not tried anything for her symptoms.  She also denies any dysuria, abnormal vaginal bleeding or itching.      Past Medical History  Diagnosis Date  . Supervision of normal pregnancy in second trimester 04/04/2013   History reviewed. No pertinent past surgical history. Family History  Problem Relation Age of Onset  . Diabetes Mother   . Hypertension Mother   . Cancer Maternal Aunt   . Diabetes Maternal Grandmother    Social History  Substance Use Topics  . Smoking status: Current Some Day Smoker -- 0.02 packs/day for 2 years    Types: Cigarettes  . Smokeless tobacco: Never Used  . Alcohol Use: No   OB History    Gravida Para Term Preterm AB TAB SAB Ectopic Multiple Living   Review of Systems  Constitutional: Negative for fever, activity change and appetite change.  Respiratory: Negative for shortness of breath.   Cardiovascular: Negative for chest pain.  Gastrointestinal: Negative for nausea, vomiting, abdominal pain and abdominal distention.  Genitourinary: Positive for vaginal discharge. Negative for dysuria, hematuria, vaginal bleeding, genital sores and vaginal pain.  Musculoskeletal: Negative for myalgias.  Skin: Negative for rash.  Neurological: Negative for dizziness, weakness, numbness and headaches.  All other systems reviewed and are  negative.     Allergies  Review of patient's allergies indicates no known allergies.  Home Medications   Prior to Admission medications   Medication Sig Start Date End Date Taking? Authorizing Provider  acetaminophen (TYLENOL) 325 MG tablet Take 650 mg by mouth every 6 (six) hours as needed for mild pain.    Yes Historical Provider, MD  etonogestrel (NEXPLANON) 68 MG IMPL implant 1 each by Subdermal route once.   Yes Historical Provider, MD   BP 118/65 mmHg  Pulse 98  Temp(Src) 100 F (37.8 C) (Oral)  Resp 16  Ht  (1.575 m)  Wt 68.04 kg  BMI 27.43 kg/m2  SpO2 100%  LMP 01/14/2015 Physical Exam  Constitutional: She is oriented to person, place, and time. She appears well-developed and well-nourished. No distress.  HENT:  Head: Atraumatic.  Mouth/Throat: Oropharynx is clear and moist.  Neck: Neck supple.  Cardiovascular: Normal rate, regular rhythm and intact distal pulses.   Pulmonary/Chest: Effort normal and breath sounds normal. No respiratory distress. She exhibits no tenderness.  Abdominal: Soft. She exhibits no distension. There is no tenderness. There is no rebound and no guarding.  Genitourinary: Uterus normal. There is no rash or lesion on the right labia. There is no rash or lesion on the left labia. Cervix exhibits no motion tenderness. Right adnexum displays no mass and no tenderness. Left adnexum displays no mass and no tenderness. No tenderness or bleeding in the vagina. No foreign body around the vagina. No signs of injury around the vagina. Vaginal discharge found.  Pelvic chaperoned by nursing staff.  Moderate white, thin vaginal discharge.  No adnexal tenderness or masses, no CMT.    Musculoskeletal: Normal range of motion.  Lymphadenopathy:    She has no cervical adenopathy.  Neurological: She is alert and oriented to person, place, and time.  Skin: Skin is warm and dry. No rash noted.  Psychiatric: She has a normal mood and affect.  Nursing note and  vitals reviewed.   ED Course  Procedures (including critical care time) Labs Review Labs Reviewed  WET PREP, GENITAL - Abnormal; Notable for the following:    WBC, Wet Prep HPF POC MODERATE (*)    All other components within normal limits  URINALYSIS, ROUTINE W REFLEX MICROSCOPIC (NOT AT [facility]) - Abnormal; Notable for the following:    Hgb urine dipstick LARGE (*)    Protein, ur TRACE (*)    Leukocytes, UA MODERATE (*)    All other components within normal limits  URINE MICROSCOPIC-ADD ON - Abnormal; Notable for the following:    Squamous Epithelial / LPF TOO NUMEROUS TO COUNT (*)    Bacteria, UA FEW (*)    All other components within normal limits  URINE CULTURE  RPR  POC URINE PREG, ED  GC/CHLAMYDIA PROBE AMP ([place]) NOT AT [facility]    Imaging Review No results found. I have personally reviewed and evaluated these images and lab results as part of my medical decision-making.  GC, urine and chlamydia cultures pending.    MDM   Final diagnoses:  Vaginitis    Pt is well appearing, vitals stable.  Ambulated to the restroom w/o difficulty, steady gait.  No concerning sx's for PID, or TOA on exam.  Results discussed with pt.  She agrees to tx with IM rocephin and po Zithromax, referral [facility] and agrees to close f/u or return here for worsening sx's.      Pauline Ausammy Konnie Noffsinger, PA-C 02/06/15 [id number]  Bethann BerkshireJoseph Zammit, MD 02/08/15 1027

## 2015-02-09 LAB — GC/CHLAMYDIA PROBE AMP (~~LOC~~) NOT AT ARMC
CHLAMYDIA, DNA PROBE: POSITIVE — AB
Neisseria Gonorrhea: NEGATIVE

## 2015-02-12 ENCOUNTER — Telehealth (HOSPITAL_COMMUNITY): Payer: Self-pay

## 2015-02-12 NOTE — Telephone Encounter (Signed)
Positive for chlamydia Treated per protocol. DHHS form faxed. Attempting to contact. Unable to reach by telephone. Letter sent to address on record.  

## 2015-02-26 IMAGING — US US OB LIMITED
1 series · 14 of 20 positions shown · non-contrast
Comparison: none

CLINICAL DATA: Pregnant, cramping, no fetal heart tones

EXAM:
LIMITED OBSTETRIC ULTRASOUND

[Series 1: us ob limited · 0.21mm/px · 20 acquisitions, 14 frames shown]
[im 1/20]
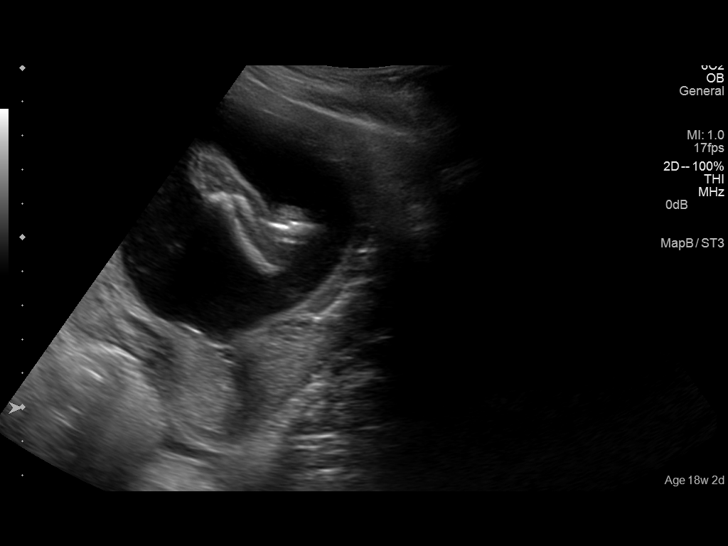
[im 3/20]
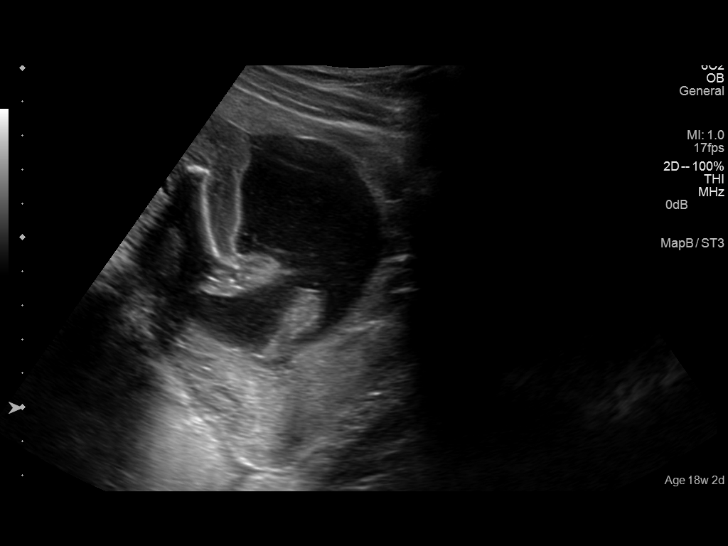
[im 4/20]
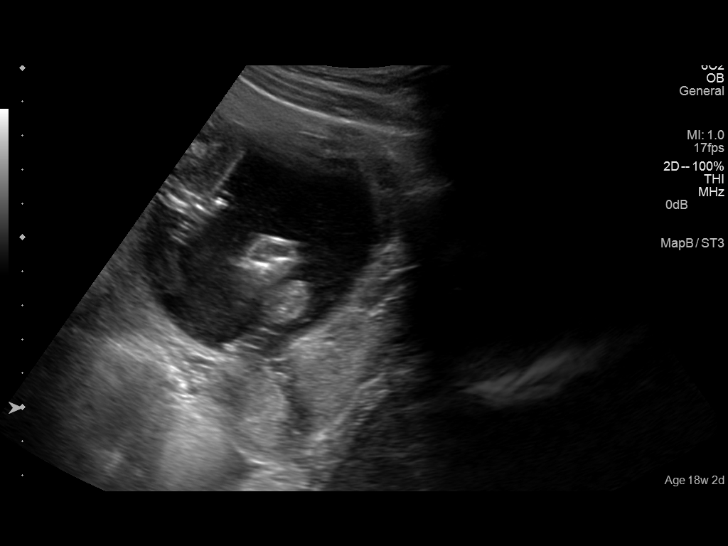
[im 6/20]
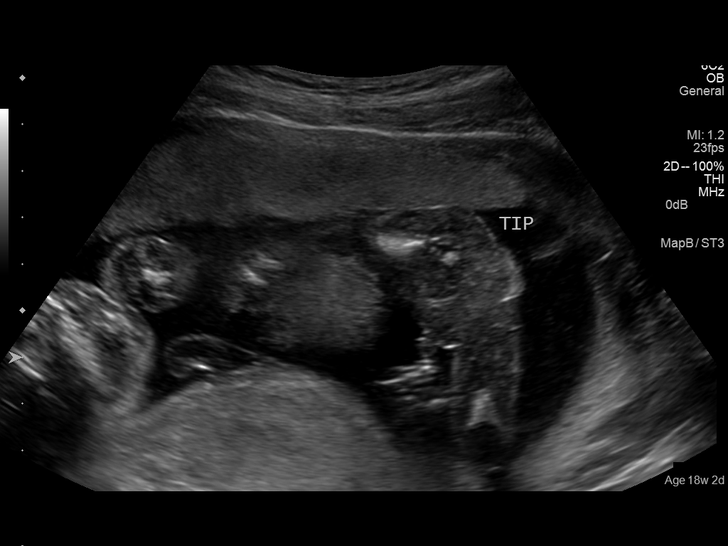
[im 7/20]
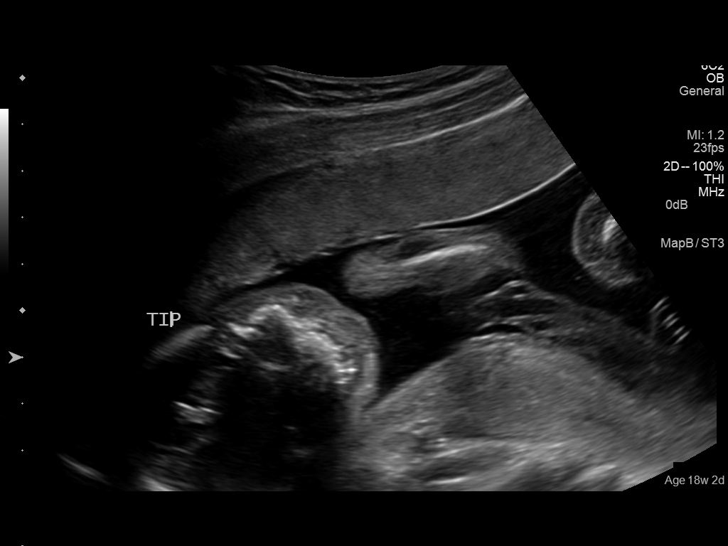
[im 8/20]
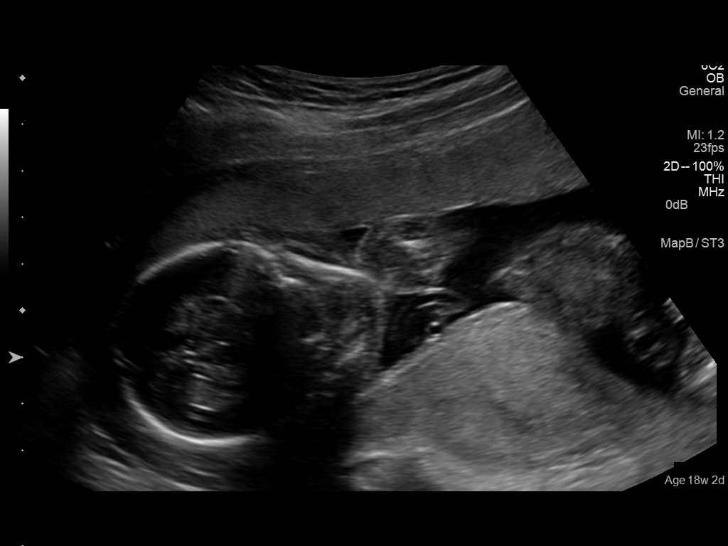
[im 10/20]
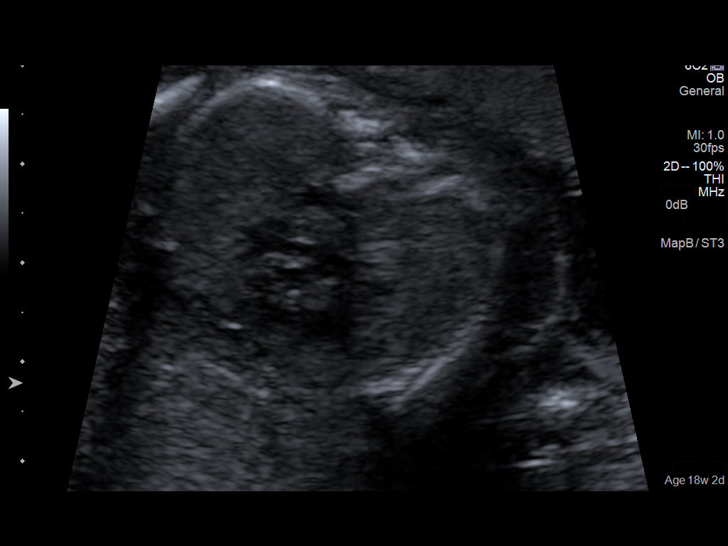
[im 11/20]
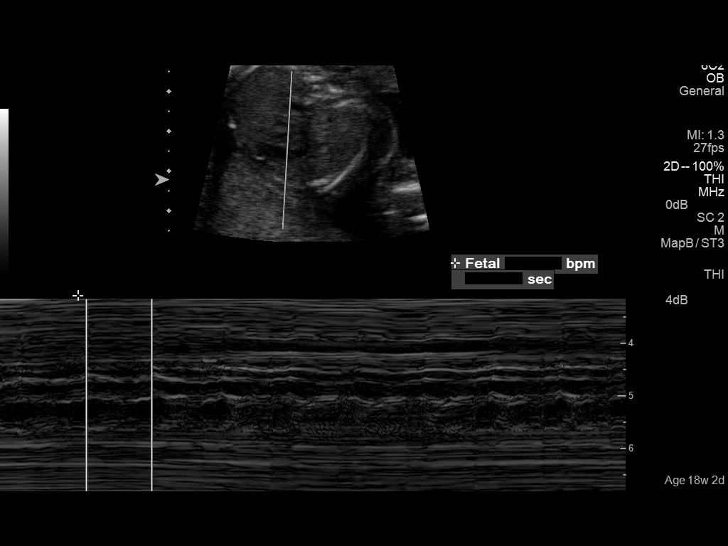
[im 13/20]
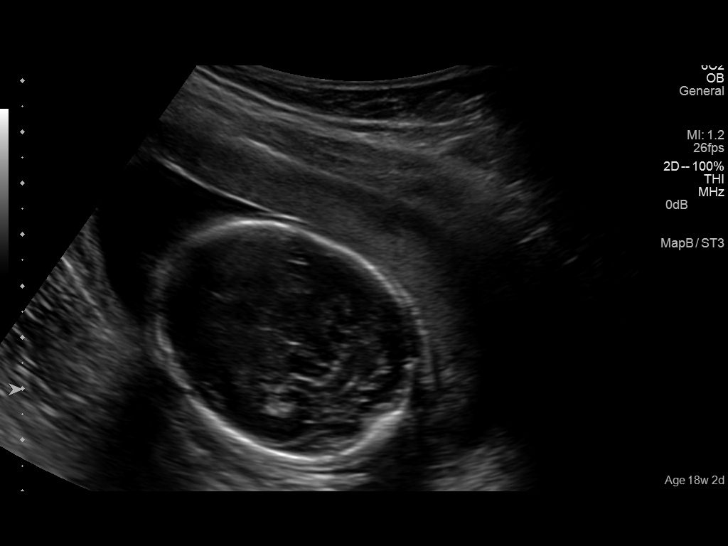
[im 14/20]
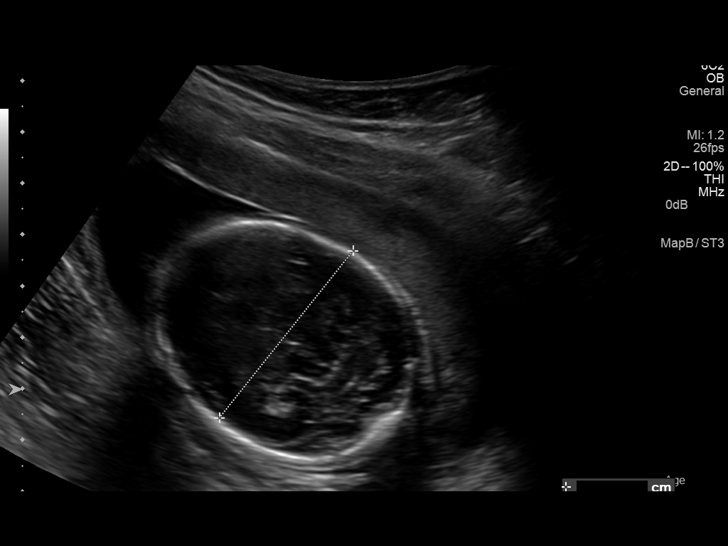
[im 16/20]
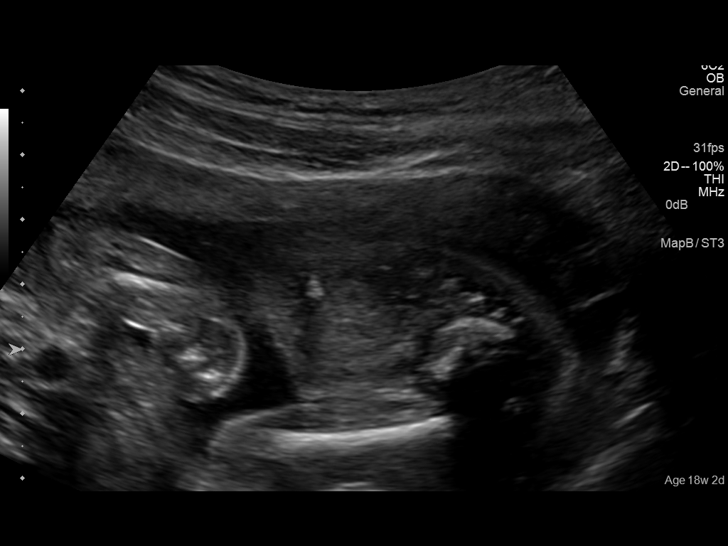
[im 17/20]
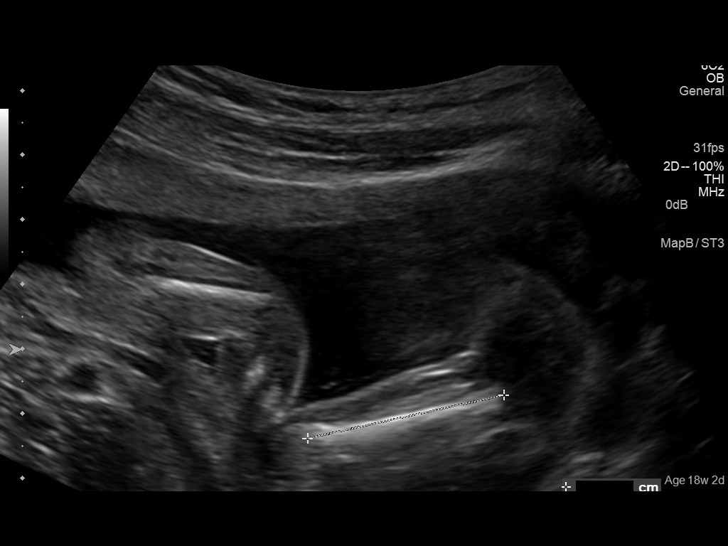
[im 18/20]
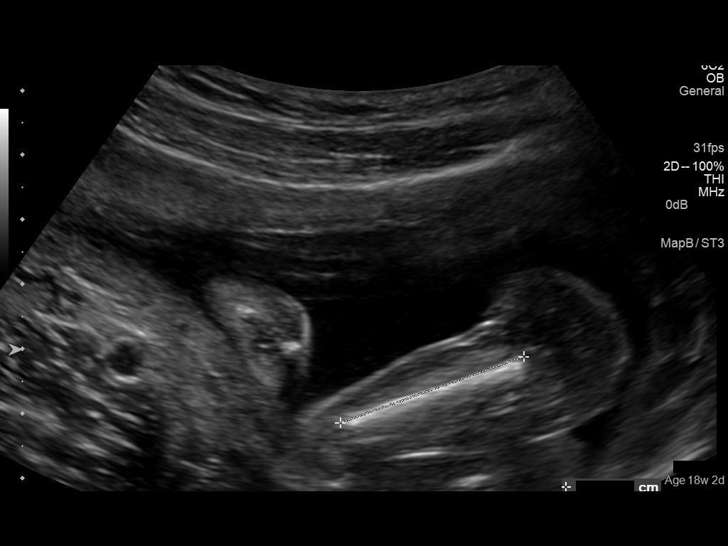
[im 20/20]
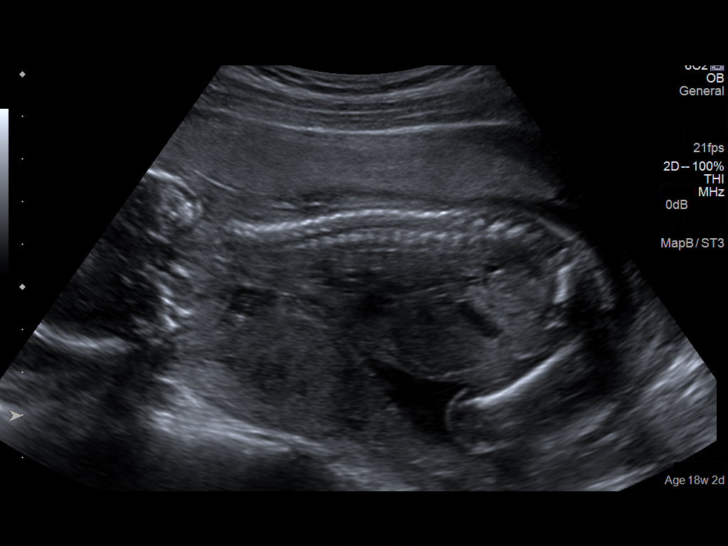

[14 of 20 positions shown; findings below may reference images not displayed]

FINDINGS: Number of Fetuses: 1

Heart Rate:  168 bpm

Movement: Yes

Presentation: Breech

Placental Location: Anterior

Previa: No

Amniotic Fluid (Subjective):  Within normal limits.

BPD:  4.19 cm 18 w 5 d

MATERNAL FINDINGS:

Cervix:  Appears closed.

Uterus/Adnexae:  No abnormality visualized.
IMPRESSION: Single live intrauterine gestation with positive fetal heart tones,
as described above.

This exam is performed on an emergent basis and does not
comprehensively evaluate fetal size, dating, or anatomy; follow-up
complete OB US should be considered if further fetal assessment is
warranted.

## 2015-06-08 DIAGNOSIS — Z720 Tobacco use: Secondary | ICD-10-CM | POA: Diagnosis not present

## 2015-06-08 DIAGNOSIS — Z124 Encounter for screening for malignant neoplasm of cervix: Secondary | ICD-10-CM | POA: Diagnosis not present

## 2015-06-08 DIAGNOSIS — Z113 Encounter for screening for infections with a predominantly sexual mode of transmission: Secondary | ICD-10-CM | POA: Diagnosis not present

## 2015-06-08 DIAGNOSIS — Z01419 Encounter for gynecological examination (general) (routine) without abnormal findings: Secondary | ICD-10-CM | POA: Diagnosis not present

## 2015-06-08 DIAGNOSIS — Z3046 Encounter for surveillance of implantable subdermal contraceptive: Secondary | ICD-10-CM | POA: Diagnosis not present

## 2016-01-02 ENCOUNTER — Emergency Department (HOSPITAL_COMMUNITY)
Admission: EM | Admit: 2016-01-02 | Discharge: 2016-01-02 | Disposition: A | Discharge status: Home / Self Care | Payer: BLUE CROSS/BLUE SHIELD | Attending: Emergency Medicine | Admitting: Emergency Medicine

## 2016-01-02 ENCOUNTER — Encounter (HOSPITAL_COMMUNITY): Payer: Self-pay | Admitting: Emergency Medicine

## 2016-01-02 DIAGNOSIS — F17210 Nicotine dependence, cigarettes, uncomplicated: Secondary | ICD-10-CM | POA: Diagnosis not present

## 2016-01-02 DIAGNOSIS — M545 Low back pain, unspecified: Secondary | ICD-10-CM

## 2016-01-02 MED ORDER — CYCLOBENZAPRINE HCL 10 MG PO TABS: 10.0000 mg | ORAL_TABLET | Freq: Two times a day (BID) | ORAL | 0 refills | Status: DC | PRN

## 2016-01-02 MED ORDER — IBUPROFEN 800 MG PO TABS
800.0000 mg | ORAL_TABLET | Freq: Once | ORAL | Status: AC
  Administered 2016-01-02: 800 mg via ORAL
  Filled 2016-01-02: qty 1

## 2016-01-02 MED ORDER — NAPROXEN 500 MG PO TABS: 500.0000 mg | ORAL_TABLET | Freq: Two times a day (BID) | ORAL | 0 refills | Status: DC

## 2016-01-02 MED ORDER — CYCLOBENZAPRINE HCL 10 MG PO TABS
10.0000 mg | ORAL_TABLET | Freq: Once | ORAL | Status: AC
  Administered 2016-01-02: 10 mg via ORAL
  Filled 2016-01-02: qty 1

## 2016-01-02 NOTE — ED Triage Notes (Signed)
Pt c/o sudden pain to left lower back last night. Pain worse with move ment. Pain does not radiate. Denies gu symptoms. Nad. Noted that pt has shooting pains intermittent during her triage where pain is worse.

## 2016-01-02 NOTE — ED Notes (Signed)
Pt denies injury but lifts a lot at work. Took half percocet last night with relief.

## 2016-01-02 NOTE — Discharge Instructions (Signed)
Prescription for muscle relaxer and anti-inflammatory pain medicine. Rest. Alternate heat and ice.

## 2016-01-02 NOTE — ED Provider Notes (Signed)
[facility]-EMERGENCY DEPT Provider Note   CSN: [id number] Arrival date & time: 01/02/16  [id number]  By signing my name below, I, Clovis PuAvnee Patel, attest that this documentation has been prepared under the direction and in the presence of Donnetta HutchingBrian Sequoya Hogsett, MD  Electronically Signed: Clovis PuAvnee Patel, ED Scribe. 01/02/16. 9:47 AM.   History   Chief Complaint Chief Complaint  Patient presents with  . Back Pain    The history is provided by the patient. No language interpreter was used.   HPI Comments:  Domenica ReamerShacree E Schnitzler is a 25 y.o. female who presents to the Emergency Department complaining of sudden onset, intermittent episodes of left lower back pain which began in the PM yesterday. Pt states her job requires a lot of movement (bending, lifting, twisting, pushing, pulling) at work. She has taken 1/2 of a percocet pill with relief. Pt denies any recent injury, radiation of the pain, any medical problems, difficulty urinating, any other associated symptoms and modifying factors at this time.   Past Medical History:  Diagnosis Date  . Supervision of normal pregnancy in second trimester 04/04/2013    Patient Active Problem List   Diagnosis Date Noted  . Insertion of Nexplanon 09/14/2013  . Cervical incompetence in pregnancy in second trimester 04/18/2013    History reviewed. No pertinent surgical history.  OB History    Gravida Para Term Preterm AB Living   1 1 1     1    SAB TAB Ectopic Multiple Live Births           1       Home Medications    Prior to Admission medications   Medication Sig Start Date End Date Taking? Authorizing Provider  acetaminophen (TYLENOL) 325 MG tablet Take 650 mg by mouth every 6 (six) hours as needed for mild pain.     Historical Provider, MD  cyclobenzaprine (FLEXERIL) 10 MG tablet Take 1 tablet (10 mg total) by mouth 2 (two) times daily as needed for muscle spasms. 01/02/16   Donnetta HutchingBrian Mirna Sutcliffe, MD  etonogestrel (NEXPLANON) 68 MG IMPL implant 1 each by Subdermal route once.     Historical Provider, MD  naproxen (NAPROSYN) 500 MG tablet Take 1 tablet (500 mg total) by mouth 2 (two) times daily. 01/02/16   Donnetta HutchingBrian Jill Stopka, MD    Family History Family History  Problem Relation Age of Onset  . Diabetes Mother   . Hypertension Mother   . Diabetes Maternal Grandmother   . Cancer Maternal Aunt     Social History Social History  Substance Use Topics  . Smoking status: Current Some Day Smoker    Packs/day: 0.02    Years: 2.00    Types: Cigarettes  . Smokeless tobacco: Never Used  . Alcohol use No     Allergies   Patient has no known allergies.   Review of Systems Review of Systems  Genitourinary: Negative for difficulty urinating.  Musculoskeletal: Positive for back pain.  All other systems reviewed and are negative.    Physical Exam Updated Vital Signs BP 108/61 (BP Location: Right Arm)   Pulse 67   Temp 98.6 F (37 C) (Oral)   Resp 17   Ht 5\' 2"  (1.575 m)   Wt 158 lb (71.7 kg)   LMP 01/02/2016   SpO2 99%   BMI 28.90 kg/m   Physical Exam  Constitutional: She is oriented to person, place, and time. She appears well-developed and well-nourished.  HENT:  Head: Normocephalic and atraumatic.  Eyes: Conjunctivae are normal.  Neck: Neck supple.  Cardiovascular: Normal rate and regular rhythm.   Pulmonary/Chest: Effort normal and breath sounds normal.  Abdominal: Soft. Bowel sounds are normal.  Musculoskeletal: Normal range of motion. She exhibits tenderness.  Minimal tenderness to left lower back. No CVA tenderness.   Neurological: She is alert and oriented to person, place, and time.  Skin: Skin is warm and dry.  Psychiatric: She has a normal mood and affect. Her behavior is normal.  Nursing note and vitals reviewed.  ED Treatments / Results  DIAGNOSTIC STUDIES:  Oxygen Saturation is 99% on RA, normal by my interpretation.    COORDINATION OF CARE:  9:43 AM Discussed treatment plan with pt at bedside and pt agreed to plan. Will  prescribe pt a muscle relaxer and advised her to take Ibuprofen as well.    Labs (all labs ordered are listed, but only abnormal results are displayed) Labs Reviewed - No data to display  EKG  EKG Interpretation None       Radiology No results found.  Procedures Procedures (including critical care time)  Medications Ordered in ED Medications  cyclobenzaprine (FLEXERIL) tablet 10 mg (10 mg Oral Given 01/02/16 1000)  ibuprofen (ADVIL,MOTRIN) tablet 800 mg (800 mg Oral Given 01/02/16 1057)     Initial Impression / Assessment and Plan / ED Course  I have reviewed the triage vital signs and the nursing notes.  Pertinent labs & imaging results that were available during my care of the patient were reviewed by me and considered in my medical decision making (see chart for details).  Clinical Course    History and physical consistent with muscular strain. Rx Flexeril 10 mg and Naprosyn 500 mg.   Final Clinical Impressions(s) / ED Diagnoses   Final diagnoses:  Acute left-sided low back pain without sciatica    New Prescriptions New Prescriptions   CYCLOBENZAPRINE (FLEXERIL) 10 MG TABLET    Take 1 tablet (10 mg total) by mouth 2 (two) times daily as needed for muscle spasms.   NAPROXEN (NAPROSYN) 500 MG TABLET    Take 1 tablet (500 mg total) by mouth 2 (two) times daily.  I personally performed the services described in this documentation, which was scribed in my presence. The recorded information has been reviewed and is accurate.      Donnetta HutchingBrian Cindra Austad, MD 01/02/16 1135

## 2016-03-20 ENCOUNTER — Emergency Department (HOSPITAL_COMMUNITY)
Admission: EM | Admit: 2016-03-20 | Discharge: 2016-03-20 | Disposition: A | Discharge status: Home / Self Care | Payer: BLUE CROSS/BLUE SHIELD | Attending: Emergency Medicine | Admitting: Emergency Medicine

## 2016-03-20 ENCOUNTER — Encounter (HOSPITAL_COMMUNITY): Payer: Self-pay | Admitting: Emergency Medicine

## 2016-03-20 DIAGNOSIS — F17210 Nicotine dependence, cigarettes, uncomplicated: Secondary | ICD-10-CM | POA: Insufficient documentation

## 2016-03-20 DIAGNOSIS — R1110 Vomiting, unspecified: Secondary | ICD-10-CM | POA: Diagnosis present

## 2016-03-20 DIAGNOSIS — Z79899 Other long term (current) drug therapy: Secondary | ICD-10-CM | POA: Insufficient documentation

## 2016-03-20 DIAGNOSIS — B349 Viral infection, unspecified: Secondary | ICD-10-CM

## 2016-03-20 LAB — CBC WITH DIFFERENTIAL/PLATELET
BASOS ABS: 0 10*3/uL (ref 0.0–0.1)
Basophils Relative: 0 %
Eosinophils Absolute: 0.4 10*3/uL (ref 0.0–0.7)
Eosinophils Relative: 6 %
HEMATOCRIT: 37.7 % (ref 36.0–46.0)
Hemoglobin: 12.3 g/dL (ref 12.0–15.0)
LYMPHS PCT: 39 %
Lymphs Abs: 2.4 10*3/uL (ref 0.7–4.0)
MCH: 26.2 pg (ref 26.0–34.0)
MCHC: 32.6 g/dL (ref 30.0–36.0)
MCV: 80.4 fL (ref 78.0–100.0)
Monocytes Absolute: 0.6 10*3/uL (ref 0.1–1.0)
Monocytes Relative: 9 %
NEUTROS ABS: 2.9 10*3/uL (ref 1.7–7.7)
Neutrophils Relative %: 46 %
Platelets: 295 10*3/uL (ref 150–400)
RBC: 4.69 MIL/uL (ref 3.87–5.11)
RDW: 13.8 % (ref 11.5–15.5)
WBC: 6.3 10*3/uL (ref 4.0–10.5)

## 2016-03-20 LAB — I-STAT BETA HCG BLOOD, ED (MC, WL, AP ONLY): I-stat hCG, quantitative: <5 m[IU]/mL (ref <5)

## 2016-03-20 LAB — COMPREHENSIVE METABOLIC PANEL
ALBUMIN: 3.7 g/dL (ref 3.5–5.0)
ALT: 28 U/L (ref 14–54)
ANION GAP: 6 (ref 5–15)
AST: 18 U/L (ref 15–41)
Alkaline Phosphatase: 70 U/L (ref 38–126)
BILIRUBIN TOTAL: 0.2 mg/dL — AB (ref 0.3–1.2)
BUN: 11 mg/dL (ref 6–20)
CO2: 26 mmol/L (ref 22–32)
Calcium: 8.4 mg/dL — ABNORMAL LOW (ref 8.9–10.3)
Chloride: 105 mmol/L (ref 101–111)
Creatinine, Ser: 0.63 mg/dL (ref 0.44–1.00)
GFR calc Af Amer: >60 mL/min (ref >60)
GLUCOSE: 108 mg/dL — AB (ref 65–99)
POTASSIUM: 3.3 mmol/L — AB (ref 3.5–5.1)
Sodium: 137 mmol/L (ref 135–145)
TOTAL PROTEIN: 7.1 g/dL (ref 6.5–8.1)

## 2016-03-20 MED ORDER — ONDANSETRON HCL 4 MG/2ML IJ SOLN
4.0000 mg | Freq: Once | INTRAMUSCULAR | Status: DC
  Filled 2016-03-20: qty 2

## 2016-03-20 MED ORDER — SODIUM CHLORIDE 0.9 % IV BOLUS (SEPSIS)
1000.0000 mL | Freq: Once | INTRAVENOUS | Status: AC
  Administered 2016-03-20: 1000 mL via INTRAVENOUS

## 2016-03-20 NOTE — ED Notes (Signed)
ED Provider at bedside. 

## 2016-03-20 NOTE — ED Triage Notes (Signed)
Having cold since Monday, self treated with OTC medications.  After eating lunch today at work.  Pt says she vomited.  Pt checked pts BP VS via first responders at work.  BP 90's /40's.  Pt says SBP 100's after standing.   Denies any pain or nausea at this time.

## 2016-03-20 NOTE — Discharge Instructions (Signed)
Drink plenty of fluids.  Follow-up with your doctor if needed.  Rest at home tomorrow.

## 2016-03-20 NOTE — ED Provider Notes (Signed)
[facility] DEPT Provider Note   CSN: [id number] Arrival date & time: 03/20/16  1308  By signing my name below, I, Cynda Acres, attest that this documentation has been prepared under the direction and in the presence of Bethann Berkshire, MD. Electronically Signed: Cynda Acres, Scribe. 03/20/16. 1:29 PM.   History   Chief Complaint Chief Complaint  Patient presents with  . Hypotension    HPI Comments: Marie Rivera is a 26 y.o. female who presents to the Emergency Department complaining of sudden-onset vomiting that began yesterday. Patient states she has had cold symptoms that began 6 days ago, was sent home from work on Tuesday due to weakness. Patient states her blood pressure was low according to first responder her BP ws in the 90's/40's. Patient has associated vomiting x2 episodes, cough, rhinorrhea, and generalized weakness. No modifying factors indicated. Patient denies any other symptoms.   The history is provided by the patient. No language interpreter was used.  Emesis   This is a new problem. The current episode started yesterday. The problem has not changed since onset.The emesis has an appearance of stomach contents. There has been no fever. Associated symptoms include cough. Pertinent negatives include no abdominal pain, no diarrhea, no fever and no headaches.    Past Medical History:  Diagnosis Date  . Supervision of normal pregnancy in second trimester 04/04/2013    Patient Active Problem List   Diagnosis Date Noted  . Insertion of Nexplanon 09/14/2013  . Cervical incompetence in pregnancy in second trimester 04/18/2013    No past surgical history on file.  OB History    Gravida Para Term Preterm AB Living   1 1 1     1    SAB TAB Ectopic Multiple Live Births           1       Home Medications    Prior to Admission medications   Medication Sig Start Date End Date Taking? Authorizing Provider  acetaminophen (TYLENOL) 325 MG tablet Take 650 mg by  mouth every 6 (six) hours as needed for mild pain.     Historical Provider, MD  cyclobenzaprine (FLEXERIL) 10 MG tablet Take 1 tablet (10 mg total) by mouth 2 (two) times daily as needed for muscle spasms. 01/02/16   Donnetta Hutching, MD  etonogestrel (NEXPLANON) 68 MG IMPL implant 1 each by Subdermal route once.    Historical Provider, MD  naproxen (NAPROSYN) 500 MG tablet Take 1 tablet (500 mg total) by mouth 2 (two) times daily. 01/02/16   Donnetta Hutching, MD    Family History Family History  Problem Relation Age of Onset  . Diabetes Mother   . Hypertension Mother   . Diabetes Maternal Grandmother   . Cancer Maternal Aunt     Social History Social History  Substance Use Topics  . Smoking status: Current Some Day Smoker    Packs/day: 0.02    Years: 2.00    Types: Cigarettes  . Smokeless tobacco: Never Used  . Alcohol use No     Allergies   Patient has no known allergies.   Review of Systems Review of Systems  Constitutional: Negative for appetite change, fatigue and fever.  HENT: Negative for congestion, ear discharge and sinus pressure.   Eyes: Negative for discharge.  Respiratory: Positive for cough.   Cardiovascular: Negative for chest pain.  Gastrointestinal: Positive for vomiting. Negative for abdominal pain and diarrhea.  Genitourinary: Negative for frequency and hematuria.  Musculoskeletal: Negative for back pain.  Skin: Negative for rash.  Neurological: Negative for seizures and headaches.  Psychiatric/Behavioral: Negative for hallucinations.     Physical Exam Updated Vital Signs BP 106/65 (BP Location: Left Arm)   Pulse 81   Temp 98.5 F (36.9 C) (Oral)   Resp 18   SpO2 100%   Physical Exam  Constitutional: She is oriented to person, place, and time. She appears well-developed.  HENT:  Head: Normocephalic.  Eyes: Conjunctivae and EOM are normal. No scleral icterus.  Neck: Neck supple. No thyromegaly present.  Cardiovascular: Normal rate and regular  rhythm.  Exam reveals no gallop and no friction rub.   No murmur heard. Pulmonary/Chest: No stridor. She has no wheezes. She has no rales. She exhibits no tenderness.  Abdominal: She exhibits no distension. There is no tenderness. There is no rebound.  Musculoskeletal: Normal range of motion. She exhibits no edema.  Lymphadenopathy:    She has no cervical adenopathy.  Neurological: She is oriented to person, place, and time. She exhibits normal muscle tone. Coordination normal.  Skin: No rash noted. No erythema.  Psychiatric: She has a normal mood and affect. Her behavior is normal.     ED Treatments / Results  DIAGNOSTIC STUDIES: Oxygen Saturation is 100% on RA, normal by my interpretation.    COORDINATION OF CARE: 1:29 PM Discussed treatment plan with pt at bedside and pt agreed to plan.  Labs (all labs ordered are listed, but only abnormal results are displayed) Labs Reviewed - No data to display  EKG  EKG Interpretation None       Radiology No results found.  Procedures Procedures (including critical care time)  Medications Ordered in ED Medications - No data to display   Initial Impression / Assessment and Plan / ED Course  I have reviewed the triage vital signs and the nursing notes.  Pertinent labs & imaging results that were available during my care of the patient were reviewed by me and considered in my medical decision making (see chart for details).     Patient with viral syndrome.  Patient improved with IV fluids.  She will be sent home and told to drink fluids rest and Tylenol as needed  Final Clinical Impressions(s) / ED Diagnoses   Final diagnoses:  None    New Prescriptions New Prescriptions   No medications on file   The chart was scribed for me under my direct supervision.  I personally performed the history, physical, and medical decision making and all procedures in the evaluation of this patient.Bethann Berkshire.    Joelle Flessner, MD 03/20/16  [phone]

## 2016-04-12 ENCOUNTER — Encounter (HOSPITAL_COMMUNITY): Payer: Self-pay | Admitting: *Deleted

## 2016-04-12 ENCOUNTER — Emergency Department (HOSPITAL_COMMUNITY)
Admission: EM | Admit: 2016-04-12 | Discharge: 2016-04-12 | Disposition: A | Discharge status: Home Health | Payer: BLUE CROSS/BLUE SHIELD | Attending: Emergency Medicine | Admitting: Emergency Medicine

## 2016-04-12 DIAGNOSIS — Z79899 Other long term (current) drug therapy: Secondary | ICD-10-CM | POA: Insufficient documentation

## 2016-04-12 DIAGNOSIS — B9689 Other specified bacterial agents as the cause of diseases classified elsewhere: Secondary | ICD-10-CM

## 2016-04-12 DIAGNOSIS — R300 Dysuria: Secondary | ICD-10-CM | POA: Diagnosis present

## 2016-04-12 DIAGNOSIS — N760 Acute vaginitis: Secondary | ICD-10-CM | POA: Diagnosis not present

## 2016-04-12 DIAGNOSIS — F17210 Nicotine dependence, cigarettes, uncomplicated: Secondary | ICD-10-CM | POA: Insufficient documentation

## 2016-04-12 LAB — URINALYSIS, ROUTINE W REFLEX MICROSCOPIC
BILIRUBIN URINE: NEGATIVE
GLUCOSE, UA: NEGATIVE mg/dL
HGB URINE DIPSTICK: NEGATIVE
KETONES UR: NEGATIVE mg/dL
Leukocytes, UA: NEGATIVE
Nitrite: NEGATIVE
PH: 7 (ref 5.0–8.0)
Protein, ur: NEGATIVE mg/dL
Specific Gravity, Urine: 1.013 (ref 1.005–1.030)

## 2016-04-12 LAB — WET PREP, GENITAL
Clue Cells Wet Prep HPF POC: NONE SEEN
Sperm: NONE SEEN
Trich, Wet Prep: NONE SEEN
WBC, Wet Prep HPF POC: NONE SEEN
Yeast Wet Prep HPF POC: NONE SEEN

## 2016-04-12 MED ORDER — METRONIDAZOLE 500 MG PO TABS: 500.0000 mg | ORAL_TABLET | Freq: Two times a day (BID) | ORAL | 0 refills | Status: DC

## 2016-04-12 NOTE — ED Notes (Signed)
POC Pregnancy test resulted NEGATIVE

## 2016-04-12 NOTE — ED Triage Notes (Signed)
C/o discharge and foul smelling urine times 3 weeks.

## 2016-04-12 NOTE — ED Provider Notes (Signed)
[facility] Provider Note   CSN: [id number] Arrival date & time: 04/12/16  1617     History   Chief Complaint Chief Complaint  Patient presents with  . Dysuria    HPI Marie Rivera is a 26 y.o. female presenting with a mild vaginal discharge, white, watery and with foul smelling urine for the past 3 weeks.  She is sexually active in a monogamous relationship and partner has no symptoms.  She denies fever, chills, nausea, vomiting, abdominal or back pain.  She has had no dysuria or increased urinary frequency.  She was treated for bacterial vaginosis several months ago but states she never completed the treatment and her symptoms today are similar to this infection.  She has found no alleviators.  The history is provided by the patient.    Past Medical History:  Diagnosis Date  . Supervision of normal pregnancy in second trimester 04/04/2013    Patient Active Problem List   Diagnosis Date Noted  . Insertion of Nexplanon 09/14/2013  . Cervical incompetence in pregnancy in second trimester 04/18/2013    History reviewed. No pertinent surgical history.  OB History    Gravida Para Term Preterm AB Living   1 1 1     1    SAB TAB Ectopic Multiple Live Births           1       Home Medications    Prior to Admission medications   Medication Sig Start Date End Date Taking? Authorizing Provider  acetaminophen (TYLENOL) 325 MG tablet Take 650 mg by mouth every 6 (six) hours as needed for mild pain.     Historical Provider, MD  cyclobenzaprine (FLEXERIL) 10 MG tablet Take 1 tablet (10 mg total) by mouth 2 (two) times daily as needed for muscle spasms. 01/02/16   Donnetta Hutching, MD  etonogestrel (NEXPLANON) 68 MG IMPL implant 1 each by Subdermal route once.    Historical Provider, MD  metroNIDAZOLE (FLAGYL) 500 MG tablet Take 1 tablet (500 mg total) by mouth 2 (two) times daily. 04/12/16   Burgess Amor, PA-C  naproxen (NAPROSYN) 500 MG tablet Take 1 tablet (500 mg total) by mouth  2 (two) times daily. 01/02/16   Donnetta Hutching, MD    Family History Family History  Problem Relation Age of Onset  . Diabetes Mother   . Hypertension Mother   . Diabetes Maternal Grandmother   . Cancer Maternal Aunt     Social History Social History  Substance Use Topics  . Smoking status: Current Some Day Smoker    Packs/day: 0.02    Years: 2.00    Types: Cigarettes  . Smokeless tobacco: Never Used  . Alcohol use No     Allergies   Patient has no known allergies.   Review of Systems Review of Systems  Constitutional: Negative for fever.  HENT: Negative for congestion and sore throat.   Eyes: Negative.   Respiratory: Negative for chest tightness and shortness of breath.   Cardiovascular: Negative for chest pain.  Gastrointestinal: Negative for abdominal pain, nausea and vomiting.  Genitourinary: Positive for vaginal discharge. Negative for dysuria, hematuria and pelvic pain.  Musculoskeletal: Negative for arthralgias, joint swelling and neck pain.  Skin: Negative.  Negative for rash and wound.  Neurological: Negative for dizziness, weakness, light-headedness, numbness and headaches.  Psychiatric/Behavioral: Negative.      Physical Exam Updated Vital Signs BP 116/74 (BP Location: Left Arm)   Pulse 92   Temp 99.8 F (37.7  C) (Oral)   Resp 16   Ht 5\' 2"  (1.575 m)   Wt 71.7 kg   LMP 04/07/2016   SpO2 100%   BMI 28.90 kg/m   Physical Exam  Constitutional: She appears well-developed and well-nourished.  HENT:  Head: Normocephalic and atraumatic.  Eyes: Conjunctivae are normal.  Neck: Normal range of motion.  Cardiovascular: Normal rate, regular rhythm, normal heart sounds and intact distal pulses.   Pulmonary/Chest: Effort normal and breath sounds normal. She has no wheezes.  Abdominal: Soft. Bowel sounds are normal. There is no tenderness.  Genitourinary: Uterus normal. Uterus is not tender. Cervix exhibits no motion tenderness, no discharge and no  friability. Right adnexum displays no mass, no tenderness and no fullness. Left adnexum displays no mass, no tenderness and no fullness. Vaginal discharge found.  Genitourinary Comments: Small amount of thin white dc.  Musculoskeletal: Normal range of motion.  Neurological: She is alert.  Skin: Skin is warm and dry.  Psychiatric: She has a normal mood and affect.  Nursing note and vitals reviewed.    ED Treatments / Results  Labs (all labs ordered are listed, but only abnormal results are displayed)  Results for orders placed or performed during the hospital encounter of 04/12/16  Wet prep, genital  Result Value Ref Range   Yeast Wet Prep HPF POC NONE SEEN NONE SEEN   Trich, Wet Prep NONE SEEN NONE SEEN   Clue Cells Wet Prep HPF POC NONE SEEN NONE SEEN   WBC, Wet Prep HPF POC NONE SEEN NONE SEEN   Sperm NONE SEEN   Urinalysis, Routine w reflex microscopic  Result Value Ref Range   Color, Urine YELLOW YELLOW   APPearance CLEAR CLEAR   Specific Gravity, Urine 1.013 1.005 - 1.030   pH 7.0 5.0 - 8.0   Glucose, UA NEGATIVE NEGATIVE mg/dL   Hgb urine dipstick NEGATIVE NEGATIVE   Bilirubin Urine NEGATIVE NEGATIVE   Ketones, ur NEGATIVE NEGATIVE mg/dL   Protein, ur NEGATIVE NEGATIVE mg/dL   Nitrite NEGATIVE NEGATIVE   Leukocytes, UA NEGATIVE NEGATIVE   Urine pregnancy (bedside POC) negative.   EKG  EKG Interpretation None       Radiology No results found.  Procedures Procedures (including critical care time)  Medications Ordered in ED Medications - No data to display   Initial Impression / Assessment and Plan / ED Course  I have reviewed the triage vital signs and the nursing notes.  Pertinent labs & imaging results that were available during my care of the patient were reviewed by me and considered in my medical decision making (see chart for details).     Pt aware gc/chlamydia pending. Wet prep today negative, but call from lab to inform the sample was dry  without saline in the tube, possibly resulting in erroneous result.  Given sx in patient that did not complete the course of flagyl for BV prior, will cover for this infection today.  Advised f/u with gyn for persistent sx.  [facility]    Final Clinical Impressions(s) / ED Diagnoses   Final diagnoses:  BV (bacterial vaginosis)    New Prescriptions New Prescriptions   METRONIDAZOLE (FLAGYL) 500 MG TABLET    Take 1 tablet (500 mg total) by mouth 2 (two) times daily.     Burgess AmorJulie Joanthony Hamza, PA-C 04/12/16 1846    Maia PlanJoshua G Long, MD 04/12/16 [phone]

## 2016-04-14 LAB — GC/CHLAMYDIA PROBE AMP (~~LOC~~) NOT AT ARMC
CHLAMYDIA, DNA PROBE: NEGATIVE
Neisseria Gonorrhea: NEGATIVE

## 2016-04-14 LAB — POC URINE PREG, ED: PREG TEST UR: NEGATIVE

## 2016-10-03 ENCOUNTER — Encounter: Payer: Self-pay | Admitting: Women's Health

## 2016-10-03 ENCOUNTER — Ambulatory Visit (INDEPENDENT_AMBULATORY_CARE_PROVIDER_SITE_OTHER): Payer: BLUE CROSS/BLUE SHIELD | Admitting: Women's Health

## 2016-10-03 VITALS — BP 90/50 | HR 80 | Wt 167.0 lb

## 2016-10-03 DIAGNOSIS — Z3046 Encounter for surveillance of implantable subdermal contraceptive: Secondary | ICD-10-CM

## 2016-10-03 NOTE — Patient Instructions (Signed)
Keep the area clean and dry.  You can remove the big bandage in 24 hours, and the small steri-strip bandage in 3-5 days.  A back up method, such as condoms, should be used for two weeks. You may have irregular vaginal bleeding for the first 6 months after the Nexplanon is placed, then the bleeding usually lightens and it is possible that you may not have any periods.  If you have any concerns, please give Marie Rivera a call.    Prenatal vitamins daily Stop smoking

## 2016-10-03 NOTE — Progress Notes (Signed)
Marie Rivera is a 26 y.o. year old African American female here for Nexplanon removal. Was placed 2015. Wants to get pregnant. Not taking pnv- to go ahead and start taking. Smokes 5 cigarettes/day- to quit now. Patient given informed consent for removal of her Nexplanon. Last pap 04/04/13, neg  BP (!) 90/50   Pulse 80   Wt 167 lb (75.8 kg)   LMP 09/29/2016   BMI 30.54 kg/m   Appropriate time out taken. Nexplanon site identified.  Area prepped in usual sterile fashon. One cc of 2% lidocaine was used to anesthetize the area at the distal end of the implant. A small stab incision was made right beside the implant on the distal portion.  The Nexplanon rod was grasped using hemostats and removed without difficulty.  There was less than 3 cc blood loss. There were no complications.  Steri-strips were applied over the small incision and a pressure bandage was applied.  The patient tolerated the procedure well.  She was instructed to keep the area clean and dry, remove pressure bandage in 24 hours, and keep insertion site covered with the steri-strip for 3-5 days.    Follow-up: pt will call to schedule pap, wants to make sure doesn't interfere w/ sons appt  Marge Duncans CNM, [facility] 10/03/2016 12:47 PM

## 2016-11-19 ENCOUNTER — Encounter (HOSPITAL_COMMUNITY): Payer: Self-pay | Admitting: Emergency Medicine

## 2016-11-19 ENCOUNTER — Emergency Department (HOSPITAL_COMMUNITY)
Admission: EM | Admit: 2016-11-19 | Discharge: 2016-11-19 | Disposition: A | Discharge status: Home / Self Care | Payer: BLUE CROSS/BLUE SHIELD | Attending: Emergency Medicine | Admitting: Emergency Medicine

## 2016-11-19 DIAGNOSIS — M545 Low back pain: Secondary | ICD-10-CM | POA: Diagnosis present

## 2016-11-19 DIAGNOSIS — F17210 Nicotine dependence, cigarettes, uncomplicated: Secondary | ICD-10-CM | POA: Diagnosis not present

## 2016-11-19 DIAGNOSIS — M62830 Muscle spasm of back: Secondary | ICD-10-CM | POA: Diagnosis not present

## 2016-11-19 MED ORDER — IBUPROFEN 600 MG PO TABS: 600.0000 mg | ORAL_TABLET | Freq: Four times a day (QID) | ORAL | 0 refills | Status: DC

## 2016-11-19 MED ORDER — CYCLOBENZAPRINE HCL 10 MG PO TABS: 10.0000 mg | ORAL_TABLET | Freq: Three times a day (TID) | ORAL | 0 refills | Status: DC

## 2016-11-19 NOTE — ED Provider Notes (Signed)
[facility] Provider Note   CSN: [id number] Arrival date & time: 11/19/16  0818     History   Chief Complaint Chief Complaint  Patient presents with  . Back Pain    HPI Marie Rivera is a 25 y.o. female.  Patient is a 26 year old female who presents to the emergency department with a complaint of left lower back pain.  The patient states that she has had problems with muscle strain in the past, but this is mostly on the right side. Today she presents with a left lower back problem. 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 This morning upon awakening she had severe pain and spasm on the left side. She states that time she was in tears. She did make it to her work site, but was sent home because of her condition. The patient states that instead of going home she came here to get some assistance with her back issue. No loss of bowel or bladder function. No difficulty with the paresthesia  in the saddle area. No recent operations or procedures involving the lower back area.   The history is provided by the patient.  Back Pain   Pertinent negatives include no chest pain, no abdominal pain and no dysuria.    Past Medical History:  Diagnosis Date  . Supervision of normal pregnancy in second trimester 04/04/2013    Patient Active Problem List   Diagnosis Date Noted  . Insertion of Nexplanon 09/14/2013  . History of cervical incompetence 04/18/2013    History reviewed. No pertinent surgical history.  OB History    Gravida Para Term Preterm AB Living   SAB TAB Ectopic Multiple Live Births           1       Home Medications    Prior to Admission medications   Medication Sig Start Date End Date Taking? Authorizing Provider  acetaminophen (TYLENOL) 325 MG tablet Take 650 mg by mouth every 6 (six) hours as needed for mild pain.     [provider]  cyclobenzaprine (FLEXERIL) 10 MG tablet Take 1  tablet (10 mg total) by mouth 2 (two) times daily as needed for muscle spasms. Patient not taking: Reported on 10/03/2016 01/02/16   Marie Hutching, MD  etonogestrel (NEXPLANON) 68 MG IMPL implant 1 each by Subdermal route once.    [provider]  metroNIDAZOLE (FLAGYL) 500 MG tablet Take 1 tablet (500 mg total) by mouth 2 (two) times daily. Patient not taking: Reported on 10/03/2016 04/12/16   Burgess Amor, PA-C  naproxen (NAPROSYN) 500 MG tablet Take 1 tablet (500 mg total) by mouth 2 (two) times daily. Patient not taking: Reported on 10/03/2016 01/02/16   Marie Hutching, MD    Family History Family History  Problem Relation Age of Onset  . Diabetes Mother   . Hypertension Mother   . Diabetes Maternal Grandmother   . Cancer Maternal Aunt     Social History Social History  Substance Use Topics  . Smoking status: Current Some Day Smoker    Packs/day: 0.02    Years: 2.00    Types: Cigarettes  . Smokeless tobacco: Never Used  . Alcohol use No     Allergies   Patient has no known allergies.   Review of Systems Review of Systems  Constitutional: Negative for activity change.       All  ROS Neg except as noted in HPI  HENT: Negative for nosebleeds.   Eyes: Negative for photophobia and discharge.  Respiratory: Negative for cough, shortness of breath and wheezing.   Cardiovascular: Negative for chest pain and palpitations.  Gastrointestinal: Negative for abdominal pain and blood in stool.  Genitourinary: Negative for dysuria, frequency and hematuria.  Musculoskeletal: Positive for back pain. Negative for arthralgias and neck pain.  Skin: Negative.   Neurological: Negative for dizziness, seizures and speech difficulty.  Psychiatric/Behavioral: Negative for confusion and hallucinations.     Physical Exam Updated Vital Signs BP 109/63 (BP Location: Right Arm)   Pulse 81   Temp 99.3 F (37.4 C) (Oral)   Resp 18   Ht  (1.676 m)   Wt 72.6 kg (160 lb)   LMP 10/12/2016    SpO2 100%   BMI 25.82 kg/m   Physical Exam  Constitutional: She is oriented to person, place, and time. She appears well-developed and well-nourished.  Non-toxic appearance.  HENT:  Head: Normocephalic.  Right Ear: Tympanic membrane and external ear normal.  Left Ear: Tympanic membrane and external ear normal.  Eyes: Pupils are equal, round, and reactive to light. EOM and lids are normal.  Neck: Normal range of motion. Neck supple. Carotid bruit is not present.  Cardiovascular: Normal rate, regular rhythm, normal heart sounds, intact distal pulses and normal pulses.   Pulmonary/Chest: Breath sounds normal. No respiratory distress.  Abdominal: Soft. Bowel sounds are normal. There is no tenderness. There is no guarding.  Musculoskeletal: Normal range of motion.       Lumbar back: She exhibits pain and spasm.       Back:  Lymphadenopathy:       Head (right side): No submandibular adenopathy present.       Head (left side): No submandibular adenopathy present.    She has no cervical adenopathy.  Neurological: She is alert and oriented to person, place, and time. She has normal strength. No cranial nerve deficit or sensory deficit.  Skin: Skin is warm and dry.  Psychiatric: She has a normal mood and affect. Her speech is normal.  Nursing note and vitals reviewed.    ED Treatments / Results  Labs (all labs ordered are listed, but only abnormal results are displayed) Labs Reviewed - No data to display  EKG  EKG Interpretation None       Radiology No results found.  Procedures Procedures (including critical care time)  Medications Ordered in ED Medications - No data to display   Initial Impression / Assessment and Plan / ED Course  I have reviewed the triage vital signs and the nursing notes.  Pertinent labs & imaging results that were available during my care of the patient were reviewed by me and considered in my medical decision making (see chart for details).         Final Clinical Impressions(s) / ED Diagnoses MDM Vital signs reviewed. No evidence of dysuria or signs of pyelonephritis. No recent direct trauma to the lower back. No signs of cauda equina. I suspect the patient has a muscle strain involving the lower back. Patient will be treated with rest, heating pad, Flexeril, and ibuprofen. Patient is to follow-up with her primary physician or return to the emergency department if any changes, problems, or concerns.   Final diagnoses:  Muscle spasm of back    New Prescriptions New Prescriptions   CYCLOBENZAPRINE (FLEXERIL) 10 MG TABLET    Take 1 tablet (10 mg total) by  mouth 3 (three) times daily.   IBUPROFEN (ADVIL,MOTRIN) 600 MG TABLET    Take 1 tablet (600 mg total) by mouth 4 (four) times daily.     Ivery Quale, PA-C 11/19/16 1610    Marie Hutching, MD 11/19/16 [phone]

## 2016-11-19 NOTE — Discharge Instructions (Signed)
Your examination is consistent with muscle spasm involving the left lower back area. Please rest your back is much as possible. Apply heating pad when you are resting. Use Flexeril 3 times daily use ibuprofen 4 times daily. Please see your primary physician or return to the emergency department if any changes, problems, or concerns.

## 2016-11-19 NOTE — ED Triage Notes (Signed)
Pt reports left lumbar pain since yesterday. Pt reports history of same last year. Pt denies any known injury but reports lift heavy objects at work.

## 2016-12-25 DIAGNOSIS — Z3A38 38 weeks gestation of pregnancy: Secondary | ICD-10-CM | POA: Diagnosis not present

## 2016-12-25 DIAGNOSIS — Z3483 Encounter for supervision of other normal pregnancy, third trimester: Secondary | ICD-10-CM | POA: Diagnosis not present

## 2017-02-05 ENCOUNTER — Other Ambulatory Visit: Payer: BLUE CROSS/BLUE SHIELD | Admitting: Women's Health

## 2017-02-10 NOTE — L&D Delivery Note (Signed)
Patient is 27 y.o. G2P1001 3525w5d admitted for SOL. Prenatal course also complicated by n/a.  Delivery Note At 10:17 AM a viable female was delivered via Vaginal, Spontaneous (Presentation:ROA; cephalic  ).  APGAR:pending ; weight pending .   Placenta status: intact, .3 vessel  Cord: about 200ml were expelled with placenta.    Head delivered ROA. Meconium was seen in baby's nostrils and suctioned. No nuchal cord present. Baby rotated to right and shoulders remained transverse.Baby was then rotated from right to left and shoulder and body delivered in usual fashion. Infant with spontaneous cry, placed on mother's abdomen, dried and bulb suctioned. Large amounts of meconium seen n delivery. Cord clamped x 2  and cut by family member. Cord blood drawn. Placenta delivered spontaneously with gentle cord traction. Fundus firm with massage and Pitocin. Perineum inspected and found to have no lacerations  Anesthesia:  epidural Episiotomy:  n/a Lacerations:  n/a Est. Blood Loss (mL):  200  Mom to postpartum.  Baby to Couplet care / Skin to Skin.  Sandre KittyDaniel K Latecia Miler 09/22/2017, 10:33 AM

## 2017-02-11 ENCOUNTER — Encounter: Payer: Self-pay | Admitting: Adult Health

## 2017-02-11 ENCOUNTER — Ambulatory Visit (INDEPENDENT_AMBULATORY_CARE_PROVIDER_SITE_OTHER): Payer: BLUE CROSS/BLUE SHIELD | Admitting: Adult Health

## 2017-02-11 VITALS — BP 118/58 | HR 84 | Ht 62.0 in | Wt 165.8 lb

## 2017-02-11 DIAGNOSIS — O3680X0 Pregnancy with inconclusive fetal viability, not applicable or unspecified: Secondary | ICD-10-CM

## 2017-02-11 DIAGNOSIS — Z3201 Encounter for pregnancy test, result positive: Secondary | ICD-10-CM

## 2017-02-11 DIAGNOSIS — Z331 Pregnant state, incidental: Secondary | ICD-10-CM

## 2017-02-11 DIAGNOSIS — R112 Nausea with vomiting, unspecified: Secondary | ICD-10-CM | POA: Diagnosis not present

## 2017-02-11 DIAGNOSIS — N926 Irregular menstruation, unspecified: Secondary | ICD-10-CM | POA: Diagnosis not present

## 2017-02-11 DIAGNOSIS — Z3A01 Less than 8 weeks gestation of pregnancy: Secondary | ICD-10-CM

## 2017-02-11 LAB — POCT URINE PREGNANCY: PREG TEST UR: POSITIVE — AB

## 2017-02-11 MED ORDER — PRENATAL GUMMIES/DHA & FA 0.4-32.5 MG PO CHEW: 2.0000 | CHEWABLE_TABLET | Freq: Every day | ORAL | Status: DC

## 2017-02-11 NOTE — Progress Notes (Signed)
Subjective:     Patient ID: Marie Rivera, female   DOB: [birth year], 27 y.o.   MRN: [id number]  HPI Marie Rivera is a 27 year old black female in for UPT, has missed a period and had some nausea and occasional vomiting.   Review of Systems +missed period +nausea,occasional vomiting  Reviewed past medical,surgical, social and family history. Reviewed medications and allergies.     Objective:   Physical Exam BP (!) 118/58 (BP Location: Left Arm, Patient Position: Sitting, Cuff Size: Normal)   Pulse 84   Ht 5\' 2"  (1.575 m)   Wt 165 lb 12.8 oz (75.2 kg)   LMP 12/25/2016 (Exact Date)   BMI 30.33 kg/m UPT +, about 6+6 weeks by LMP with EDD 10/01/17.Skin warm and dry. Neck: mid line trachea, normal thyroid, good ROM, no lymphadenopathy noted. Lungs: clear to ausculation bilaterally. Cardiovascular: regular rate and rhythm.Abdomen is soft and non tender.    Assessment:     1. Pregnancy test positive   2. Less than [redacted] weeks gestation of pregnancy   3. Encounter to determine fetal viability of pregnancy, single or unspecified fetus       Plan:     Eat often Meds ordered this encounter  Medications  . Prenatal MV-Min-FA-Omega-3 (PRENATAL GUMMIES/DHA & FA) 0.4-32.5 MG CHEW    Sig: Chew 2 tablets by mouth daily.    Order Specific Question:   Supervising Provider    Answer:   Lazaro ArmsEURE, LUTHER H [[id number]]  return in 1 week for dating US Review handouts on First trimester and by [facility]

## 2017-02-11 NOTE — Patient Instructions (Signed)
First Trimester of Pregnancy The first trimester of pregnancy is from week 1 until the end of week 13 (months 1 through 3). A week after a sperm fertilizes an egg, the egg will implant on the wall of the uterus. This embryo will begin to develop into a baby. Genes from you and your partner will form the baby. The female genes will determine whether the baby will be a boy or a girl. At 6-8 weeks, the eyes and face will be formed, and the heartbeat can be seen on ultrasound. At the end of 12 weeks, all the baby's organs will be formed. Now that you are pregnant, you will want to do everything you can to have a healthy baby. Two of the most important things are to get good prenatal care and to follow your health care provider's instructions. Prenatal care is all the medical care you receive before the baby's birth. This care will help prevent, find, and treat any problems during the pregnancy and childbirth. Body changes during your first trimester Your body goes through many changes during pregnancy. The changes vary from woman to woman.  You may gain or lose a couple of pounds at first.  You may feel sick to your stomach (nauseous) and you may throw up (vomit). If the vomiting is uncontrollable, call your health care provider.  You may tire easily.  You may develop headaches that can be relieved by medicines. All medicines should be approved by your health care provider.  You may urinate more often. Painful urination may mean you have a bladder infection.  You may develop heartburn as a result of your pregnancy.  You may develop constipation because certain hormones are causing the muscles that push stool through your intestines to slow down.  You may develop hemorrhoids or swollen veins (varicose veins).  Your breasts may begin to grow larger and become tender. Your nipples may stick out more, and the tissue that surrounds them (areola) may become darker.  Your gums may bleed and may be  sensitive to brushing and flossing.  Dark spots or blotches (chloasma, mask of pregnancy) may develop on your face. This will likely fade after the baby is born.  Your menstrual periods will stop.  You may have a loss of appetite.  You may develop cravings for certain kinds of food.  You may have changes in your emotions from day to day, such as being excited to be pregnant or being concerned that something may go wrong with the pregnancy and baby.  You may have more vivid and strange dreams.  You may have changes in your hair. These can include thickening of your hair, rapid growth, and changes in texture. Some women also have hair loss during or after pregnancy, or hair that feels dry or thin. Your hair will most likely return to normal after your baby is born.  What to expect at prenatal visits During a routine prenatal visit:  You will be weighed to make sure you and the baby are growing normally.  Your blood pressure will be taken.  Your abdomen will be measured to track your baby's growth.  The fetal heartbeat will be listened to between weeks 10 and 14 of your pregnancy.  Test results from any previous visits will be discussed.  Your health care provider may ask you:  How you are feeling.  If you are feeling the baby move.  If you have had any abnormal symptoms, such as leaking fluid, bleeding, severe headaches,   or abdominal cramping.  If you are using any tobacco products, including cigarettes, chewing tobacco, and electronic cigarettes.  If you have any questions.  Other tests that may be performed during your first trimester include:  Blood tests to find your blood type and to check for the presence of any previous infections. The tests will also be used to check for low iron levels (anemia) and protein on red blood cells (Rh antibodies). Depending on your risk factors, or if you previously had diabetes during pregnancy, you may have tests to check for high blood  sugar that affects pregnant women (gestational diabetes).  Urine tests to check for infections, diabetes, or protein in the urine.  An ultrasound to confirm the proper growth and development of the baby.  Fetal screens for spinal cord problems (spina bifida) and Down syndrome.  HIV (human immunodeficiency virus) testing. Routine prenatal testing includes screening for HIV, unless you choose not to have this test.  You may need other tests to make sure you and the baby are doing well.  Follow these instructions at home: Medicines  Follow your health care provider's instructions regarding medicine use. Specific medicines may be either safe or unsafe to take during pregnancy.  Take a prenatal vitamin that contains at least 600 micrograms (mcg) of folic acid.  If you develop constipation, try taking a stool softener if your health care provider approves. Eating and drinking  Eat a balanced diet that includes fresh fruits and vegetables, whole grains, good sources of protein such as meat, eggs, or tofu, and low-fat dairy. Your health care provider will help you determine the amount of weight gain that is right for you.  Avoid raw meat and uncooked cheese. These carry germs that can cause birth defects in the baby.  Eating four or five small meals rather than three large meals a day may help relieve nausea and vomiting. If you start to feel nauseous, eating a few soda crackers can be helpful. Drinking liquids between meals, instead of during meals, also seems to help ease nausea and vomiting.  Limit foods that are high in fat and processed sugars, such as fried and sweet foods.  To prevent constipation: ? Eat foods that are high in fiber, such as fresh fruits and vegetables, whole grains, and beans. ? Drink enough fluid to keep your urine clear or pale yellow. Activity  Exercise only as directed by your health care provider. Most women can continue their usual exercise routine during  pregnancy. Try to exercise for 30 minutes at least 5 days a week. Exercising will help you: ? Control your weight. ? Stay in shape. ? Be prepared for labor and delivery.  Experiencing pain or cramping in the lower abdomen or lower back is a good sign that you should stop exercising. Check with your health care provider before continuing with normal exercises.  Try to avoid standing for long periods of time. Move your legs often if you must stand in one place for a long time.  Avoid heavy lifting.  Wear low-heeled shoes and practice good posture.  You may continue to have sex unless your health care provider tells you not to. Relieving pain and discomfort  Wear a good support bra to relieve breast tenderness.  Take warm sitz baths to soothe any pain or discomfort caused by hemorrhoids. Use hemorrhoid cream if your health care provider approves.  Rest with your legs elevated if you have leg cramps or low back pain.  If you develop   varicose veins in your legs, wear support hose. Elevate your feet for 15 minutes, 3-4 times a day. Limit salt in your diet. Prenatal care  Schedule your prenatal visits by the twelfth week of pregnancy. They are usually scheduled monthly at first, then more often in the last 2 months before delivery.  Write down your questions. Take them to your prenatal visits.  Keep all your prenatal visits as told by your health care provider. This is important. Safety  Wear your seat belt at all times when driving.  Make a list of emergency phone numbers, including numbers for family, friends, the hospital, and police and fire departments. General instructions  Ask your health care provider for a referral to a local prenatal education class. Begin classes no later than the beginning of month 6 of your pregnancy.  Ask for help if you have counseling or nutritional needs during pregnancy. Your health care provider can offer advice or refer you to specialists for help  with various needs.  Do not use hot tubs, steam rooms, or saunas.  Do not douche or use tampons or scented sanitary pads.  Do not cross your legs for long periods of time.  Avoid cat litter boxes and soil used by cats. These carry germs that can cause birth defects in the baby and possibly loss of the fetus by miscarriage or stillbirth.  Avoid all smoking, herbs, alcohol, and medicines not prescribed by your health care provider. Chemicals in these products affect the formation and growth of the baby.  Do not use any products that contain nicotine or tobacco, such as cigarettes and e-cigarettes. If you need help quitting, ask your health care provider. You may receive counseling support and other resources to help you quit.  Schedule a dentist appointment. At home, brush your teeth with a soft toothbrush and be gentle when you floss. Contact a health care provider if:  You have dizziness.  You have mild pelvic cramps, pelvic pressure, or nagging pain in the abdominal area.  You have persistent nausea, vomiting, or diarrhea.  You have a bad smelling vaginal discharge.  You have pain when you urinate.  You notice increased swelling in your face, hands, legs, or ankles.  You are exposed to fifth disease or chickenpox.  You are exposed to [place] measles (rubella) and have never had it. Get help right away if:  You have a fever.  You are leaking fluid from your vagina.  You have spotting or bleeding from your vagina.  You have severe abdominal cramping or pain.  You have rapid weight gain or loss.  You vomit blood or material that looks like coffee grounds.  You develop a severe headache.  You have shortness of breath.  You have any kind of trauma, such as from a fall or a car accident. Summary  The first trimester of pregnancy is from week 1 until the end of week 13 (months 1 through 3).  Your body goes through many changes during pregnancy. The changes vary from  woman to woman.  You will have routine prenatal visits. During those visits, your health care provider will examine you, discuss any test results you may have, and talk with you about how you are feeling. This information is not intended to replace advice given to you by your health care provider. Make sure you discuss any questions you have with your health care provider. Document Released: 01/21/2001 Document Revised: 01/09/2016 Document Reviewed: 01/09/2016 Elsevier Interactive Patient Education  2018 [employer]   Inc[employer]  

## 2017-02-16 ENCOUNTER — Telehealth: Payer: Self-pay | Admitting: Adult Health

## 2017-02-16 NOTE — Telephone Encounter (Signed)
Pt is having cramping, no bleeding, increase fluids and rest, will move US appt up til 1/9 at 1 PM from Friday 1/11.She instructed no heavy lifting or straining and that at  this early stage, this all that we can do.

## 2017-02-18 ENCOUNTER — Ambulatory Visit (INDEPENDENT_AMBULATORY_CARE_PROVIDER_SITE_OTHER): Payer: BLUE CROSS/BLUE SHIELD

## 2017-02-18 DIAGNOSIS — Z3A01 Less than 8 weeks gestation of pregnancy: Secondary | ICD-10-CM

## 2017-02-18 DIAGNOSIS — O3680X0 Pregnancy with inconclusive fetal viability, not applicable or unspecified: Secondary | ICD-10-CM | POA: Diagnosis not present

## 2017-02-18 NOTE — Progress Notes (Signed)
US 7+6 wks,single IUP w/ys,positive fht 173 bpm,normal ovaries bilat,crl 18.95 mm

## 2017-02-20 ENCOUNTER — Other Ambulatory Visit: Payer: BLUE CROSS/BLUE SHIELD

## 2017-02-27 DIAGNOSIS — Z029 Encounter for administrative examinations, unspecified: Secondary | ICD-10-CM

## 2017-03-04 ENCOUNTER — Other Ambulatory Visit (HOSPITAL_COMMUNITY)
Admission: RE | Admit: 2017-03-04 | Discharge: 2017-03-04 | Disposition: A | Discharge status: Home / Self Care | Payer: BLUE CROSS/BLUE SHIELD | Source: Ambulatory Visit | Attending: Obstetrics & Gynecology | Admitting: Obstetrics & Gynecology

## 2017-03-04 ENCOUNTER — Ambulatory Visit: Payer: BLUE CROSS/BLUE SHIELD | Admitting: *Deleted

## 2017-03-04 ENCOUNTER — Encounter: Payer: Self-pay | Admitting: Women's Health

## 2017-03-04 ENCOUNTER — Other Ambulatory Visit: Payer: Self-pay

## 2017-03-04 ENCOUNTER — Ambulatory Visit (INDEPENDENT_AMBULATORY_CARE_PROVIDER_SITE_OTHER): Payer: Medicaid Other | Admitting: Women's Health

## 2017-03-04 VITALS — BP 106/62 | HR 75 | Wt 165.0 lb

## 2017-03-04 DIAGNOSIS — N898 Other specified noninflammatory disorders of vagina: Secondary | ICD-10-CM

## 2017-03-04 DIAGNOSIS — Z3481 Encounter for supervision of other normal pregnancy, first trimester: Secondary | ICD-10-CM | POA: Insufficient documentation

## 2017-03-04 DIAGNOSIS — O09291 Supervision of pregnancy with other poor reproductive or obstetric history, first trimester: Secondary | ICD-10-CM | POA: Diagnosis not present

## 2017-03-04 DIAGNOSIS — O09299 Supervision of pregnancy with other poor reproductive or obstetric history, unspecified trimester: Secondary | ICD-10-CM

## 2017-03-04 DIAGNOSIS — Z3480 Encounter for supervision of other normal pregnancy, unspecified trimester: Secondary | ICD-10-CM

## 2017-03-04 DIAGNOSIS — Z3A09 9 weeks gestation of pregnancy: Secondary | ICD-10-CM

## 2017-03-04 DIAGNOSIS — N760 Acute vaginitis: Secondary | ICD-10-CM

## 2017-03-04 DIAGNOSIS — Z124 Encounter for screening for malignant neoplasm of cervix: Secondary | ICD-10-CM | POA: Diagnosis not present

## 2017-03-04 DIAGNOSIS — O26891 Other specified pregnancy related conditions, first trimester: Secondary | ICD-10-CM | POA: Diagnosis not present

## 2017-03-04 DIAGNOSIS — Z1389 Encounter for screening for other disorder: Secondary | ICD-10-CM

## 2017-03-04 DIAGNOSIS — Z331 Pregnant state, incidental: Secondary | ICD-10-CM

## 2017-03-04 DIAGNOSIS — R110 Nausea: Secondary | ICD-10-CM

## 2017-03-04 LAB — POCT URINALYSIS DIPSTICK
GLUCOSE UA: NEGATIVE
KETONES UA: NEGATIVE
LEUKOCYTES UA: NEGATIVE
Nitrite, UA: NEGATIVE
Protein, UA: NEGATIVE
RBC UA: NEGATIVE

## 2017-03-04 LAB — POCT WET PREP (WET MOUNT)
Clue Cells Wet Prep Whiff POC: POSITIVE
TRICHOMONAS WET PREP HPF POC: ABSENT

## 2017-03-04 MED ORDER — METRONIDAZOLE 500 MG PO TABS: 500.0000 mg | ORAL_TABLET | Freq: Two times a day (BID) | ORAL | 0 refills | Status: DC

## 2017-03-04 NOTE — Patient Instructions (Addendum)
Marie Rivera, I greatly value your feedback.  If you receive a survey following your visit with us today, we appreciate you taking the time to fill it out.  Thanks, Joellyn HaffKim Winna Golla, CNM, WHNP-BC   Nausea & Vomiting  Have saltine crackers or pretzels by your bed and eat a few bites before you raise your head out of bed in the morning  Eat small frequent meals throughout the day instead of large meals  Drink plenty of fluids throughout the day to stay hydrated, just don't drink a lot of fluids with your meals.  This can make your stomach fill up faster making you feel sick  Do not brush your teeth right after you eat  Products with real ginger are good for nausea, like ginger ale and ginger hard candy Make sure it says made with real ginger!  Sucking on sour candy like lemon heads is also good for nausea  If your prenatal vitamins make you nauseated, take them at night so you will sleep through the nausea  Sea Bands  If you feel like you need medicine for the nausea & vomiting please let us know  If you are unable to keep any fluids or food down please let us know   Constipation  Drink plenty of fluid, preferably water, throughout the day  Eat foods high in fiber such as fruits, vegetables, and grains  Exercise, such as walking, is a good way to keep your bowels regular  Drink warm fluids, especially warm prune juice, or decaf coffee  Eat a 1/2 cup of real oatmeal (not instant), 1/2 cup applesauce, and 1/2-1 cup warm prune juice every day  If needed, you may take Colace (docusate sodium) stool softener once or twice a day to help keep the stool soft. If you are pregnant, wait until you are out of your first trimester (12-14 weeks of pregnancy)  If you still are having problems with constipation, you may take Miralax once daily as needed to help keep your bowels regular.  If you are pregnant, wait until you are out of your first trimester (12-14 weeks of pregnancy)  For  Headaches:   Stay well hydrated, drink enough water so that your urine is clear, sometimes if you are dehydrated you can get headaches  Eat small frequent meals and snacks, sometimes if you are hungry you can get headaches  Sometimes you get headaches during pregnancy from the pregnancy hormones  You can try tylenol (1-2 regular strength 325mg  or 1-2 extra strength 500mg ) as directed on the box. The least amount of medication that works is best.   Cool compresses (cool wet washcloth or ice pack) to area of head that is hurting  You can also try drinking a caffeinated drink to see if this will help  If not helping, try below:  For Prevention of Headaches/Migraines:  CoQ10 100mg  three times daily  Vitamin B2 400mg  daily  Magnesium Oxide 400-600mg  daily  If You Get a Bad Headache/Migraine:  Benadryl 25mg    Magnesium Oxide  1 large Gatorade  2 extra strength Tylenol (1,000mg  total)  1 cup coffee or Coke  If this doesn't help please call us @ [phone]     First Trimester of Pregnancy The first trimester of pregnancy is from week 1 until the end of week 12 (months 1 through 3). A week after a sperm fertilizes an egg, the egg will implant on the wall of the uterus. This embryo will begin to develop into a  baby. Genes from you and your partner are forming the baby. The female genes determine whether the baby is a boy or a girl. At 6-8 weeks, the eyes and face are formed, and the heartbeat can be seen on ultrasound. At the end of 12 weeks, all the baby's organs are formed.  Now that you are pregnant, you will want to do everything you can to have a healthy baby. Two of the most important things are to get good prenatal care and to follow your health care provider's instructions. Prenatal care is all the medical care you receive before the baby's birth. This care will help prevent, find, and treat any problems during the pregnancy and childbirth. BODY CHANGES Your body goes through  many changes during pregnancy. The changes vary from woman to woman.   You may gain or lose a couple of pounds at first.  You may feel sick to your stomach (nauseous) and throw up (vomit). If the vomiting is uncontrollable, call your health care provider.  You may tire easily.  You may develop headaches that can be relieved by medicines approved by your health care provider.  You may urinate more often. Painful urination may mean you have a bladder infection.  You may develop heartburn as a result of your pregnancy.  You may develop constipation because certain hormones are causing the muscles that push waste through your intestines to slow down.  You may develop hemorrhoids or swollen, bulging veins (varicose veins).  Your breasts may begin to grow larger and become tender. Your nipples may stick out more, and the tissue that surrounds them (areola) may become darker.  Your gums may bleed and may be sensitive to brushing and flossing.  Dark spots or blotches (chloasma, mask of pregnancy) may develop on your face. This will likely fade after the baby is born.  Your menstrual periods will stop.  You may have a loss of appetite.  You may develop cravings for certain kinds of food.  You may have changes in your emotions from day to day, such as being excited to be pregnant or being concerned that something may go wrong with the pregnancy and baby.  You may have more vivid and strange dreams.  You may have changes in your hair. These can include thickening of your hair, rapid growth, and changes in texture. Some women also have hair loss during or after pregnancy, or hair that feels dry or thin. Your hair will most likely return to normal after your baby is born. WHAT TO EXPECT AT YOUR PRENATAL VISITS During a routine prenatal visit:  You will be weighed to make sure you and the baby are growing normally.  Your blood pressure will be taken.  Your abdomen will be measured to  track your baby's growth.  The fetal heartbeat will be listened to starting around week 10 or 12 of your pregnancy.  Test results from any previous visits will be discussed. Your health care provider may ask you:  How you are feeling.  If you are feeling the baby move.  If you have had any abnormal symptoms, such as leaking fluid, bleeding, severe headaches, or abdominal cramping.  If you have any questions. Other tests that may be performed during your first trimester include:  Blood tests to find your blood type and to check for the presence of any previous infections. They will also be used to check for low iron levels (anemia) and Rh antibodies. Later in the pregnancy, blood tests  for diabetes will be done along with other tests if problems develop.  Urine tests to check for infections, diabetes, or protein in the urine.  An ultrasound to confirm the proper growth and development of the baby.  An amniocentesis to check for possible genetic problems.  Fetal screens for spina bifida and Down syndrome.  You may need other tests to make sure you and the baby are doing well. HOME CARE INSTRUCTIONS  Medicines  Follow your health care provider's instructions regarding medicine use. Specific medicines may be either safe or unsafe to take during pregnancy.  Take your prenatal vitamins as directed.  If you develop constipation, try taking a stool softener if your health care provider approves. Diet  Eat regular, well-balanced meals. Choose a variety of foods, such as meat or vegetable-based protein, fish, milk and low-fat dairy products, vegetables, fruits, and whole grain breads and cereals. Your health care provider will help you determine the amount of weight gain that is right for you.  Avoid raw meat and uncooked cheese. These carry germs that can cause birth defects in the baby.  Eating four or five small meals rather than three large meals a day may help relieve nausea and  vomiting. If you start to feel nauseous, eating a few soda crackers can be helpful. Drinking liquids between meals instead of during meals also seems to help nausea and vomiting.  If you develop constipation, eat more high-fiber foods, such as fresh vegetables or fruit and whole grains. Drink enough fluids to keep your urine clear or pale yellow. Activity and Exercise  Exercise only as directed by your health care provider. Exercising will help you:  Control your weight.  Stay in shape.  Be prepared for labor and delivery.  Experiencing pain or cramping in the lower abdomen or low back is a good sign that you should stop exercising. Check with your health care provider before continuing normal exercises.  Try to avoid standing for long periods of time. Move your legs often if you must stand in one place for a long time.  Avoid heavy lifting.  Wear low-heeled shoes, and practice good posture.  You may continue to have sex unless your health care provider directs you otherwise. Relief of Pain or Discomfort  Wear a good support bra for breast tenderness.   Take warm sitz baths to soothe any pain or discomfort caused by hemorrhoids. Use hemorrhoid cream if your health care provider approves.   Rest with your legs elevated if you have leg cramps or low back pain.  If you develop varicose veins in your legs, wear support hose. Elevate your feet for 15 minutes, 3-4 times a day. Limit salt in your diet. Prenatal Care  Schedule your prenatal visits by the twelfth week of pregnancy. They are usually scheduled monthly at first, then more often in the last 2 months before delivery.  Write down your questions. Take them to your prenatal visits.  Keep all your prenatal visits as directed by your health care provider. Safety  Wear your seat belt at all times when driving.  Make a list of emergency phone numbers, including numbers for family, friends, the hospital, and police and fire  departments. General Tips  Ask your health care provider for a referral to a local prenatal education class. Begin classes no later than at the beginning of month 6 of your pregnancy.  Ask for help if you have counseling or nutritional needs during pregnancy. Your health care provider can offer  advice or refer you to specialists for help with various needs.  Do not use hot tubs, steam rooms, or saunas.  Do not douche or use tampons or scented sanitary pads.  Do not cross your legs for long periods of time.  Avoid cat litter boxes and soil used by cats. These carry germs that can cause birth defects in the baby and possibly loss of the fetus by miscarriage or stillbirth.  Avoid all smoking, herbs, alcohol, and medicines not prescribed by your health care provider. Chemicals in these affect the formation and growth of the baby.  Schedule a dentist appointment. At home, brush your teeth with a soft toothbrush and be gentle when you floss. SEEK MEDICAL CARE IF:   You have dizziness.  You have mild pelvic cramps, pelvic pressure, or nagging pain in the abdominal area.  You have persistent nausea, vomiting, or diarrhea.  You have a bad smelling vaginal discharge.  You have pain with urination.  You notice increased swelling in your face, hands, legs, or ankles. SEEK IMMEDIATE MEDICAL CARE IF:   You have a fever.  You are leaking fluid from your vagina.  You have spotting or bleeding from your vagina.  You have severe abdominal cramping or pain.  You have rapid weight gain or loss.  You vomit blood or material that looks like coffee grounds.  You are exposed to [place] and have never had them.  You are exposed to fifth disease or chickenpox.  You develop a severe headache.  You have shortness of breath.  You have any kind of trauma, such as from a fall or a car accident. Document Released: 01/21/2001 Document Revised: 06/13/2013 Document Reviewed:  12/07/2012 [facility] Patient Information 2015 [place], [place]. This information is not intended to replace advice given to you by your health care provider. Make sure you discuss any questions you have with your health care provider.

## 2017-03-04 NOTE — Progress Notes (Signed)
INITIAL OBSTETRICAL VISIT Patient name: Marie Rivera Whitis MRN [id number]  Date of birth: December 18, 1990 Chief Complaint:   Initial Prenatal Visit  History of Present Illness:   Marie Rivera Disano is a 27 y.o. 762P1001 African American female at 2821w6d by LMP c/w 8wk u/s, with an Estimated Date of Delivery: 10/01/17 being seen today for her initial obstetrical visit.   Her obstetrical history is significant for short cx dx last pregnancy @ 23wks, used prometrium q hs, had 37wk term uncomplicated svb.   Today she reports some nausea, not much vomiting- declines meds. Some occ headaches. Malodorous vaginal d/c.  Patient's last menstrual period was 12/25/2016 (exact date). Last pap 04/04/13. Results were: normal Review of Systems:   Pertinent items are noted in HPI Denies cramping/contractions, leakage of fluid, vaginal bleeding, abnormal vaginal discharge w/ itching/odor/irritation, headaches, visual changes, shortness of breath, chest pain, abdominal pain, severe nausea/vomiting, or problems with urination or bowel movements unless otherwise stated above.  Pertinent History Reviewed:  Reviewed past medical,surgical, social, obstetrical and family history.  Reviewed problem list, medications and allergies. OB History  Gravida Para Term Preterm AB Living  2 1 1     1   SAB TAB Ectopic Multiple Live Births          1    # Outcome Date GA Lbr Len/2nd Weight Sex Delivery Anes PTL Lv  2 Current           1 Term 07/25/13 1772w4d 07:12 / 02:40 7 lb 0.3 oz (3.184 kg) M Vag-Spont EPI  LIV     Physical Assessment:   Vitals:   03/04/17 1546  BP: 106/62  Pulse: 75  Weight: 165 lb (74.8 kg)  Body mass index is 30.18 kg/m.       Physical Examination:  General appearance - well appearing, and in no distress  Mental status - alert, oriented to person, place, and time  Psych:  She has a normal mood and affect  Skin - warm and dry, normal color, no suspicious lesions noted  Chest - effort normal, all lung  fields clear to auscultation bilaterally  Heart - normal rate and regular rhythm  Abdomen - soft, nontender  Extremities:  No swelling or varicosities noted  Pelvic - VULVA: normal appearing vulva with no masses, tenderness or lesions  VAGINA: normal appearing vagina with normal color and malodorous whitedischarge, no lesions  CERVIX: normal appearing cervix without discharge or lesions, no CMT  Thin prep pap is done w/ reflex HR HPV cotesting  Fetal Heart Rate (bpm): +u/s via informal transabdominal u/s  Results for orders placed or performed in visit on 03/04/17 (from the past 24 hour(s))  POCT urinalysis dipstick   Collection Time: 03/04/17  3:51 PM  Result Value Ref Range   Color, UA     Clarity, UA     Glucose, UA neg    Bilirubin, UA     Ketones, UA neg    Spec Grav, UA  1.010 - 1.025   Blood, UA neg    pH, UA  5.0 - 8.0   Protein, UA neg    Urobilinogen, UA  0.2 or 1.0 Rivera.U./dL   Nitrite, UA neg    Leukocytes, UA Negative Negative   Appearance     Odor    POCT Wet Prep Mellody Drown([facility]et [place])   Collection Time: 03/04/17  4:43 PM  Result Value Ref Range   Source Wet Prep POC vaginal    WBC, Wet Prep HPF POC few  Bacteria Wet Prep HPF POC None (A) Few   BACTERIA WET PREP MORPHOLOGY POC     Clue Cells Wet Prep HPF POC Many (A) None   Clue Cells Wet Prep Whiff POC Positive Whiff    Yeast Wet Prep HPF POC None    KOH Wet Prep POC     Trichomonas Wet Prep HPF POC Absent Absent    Assessment & Plan:  1) Low-Risk Pregnancy G2P1001 at [redacted]w[redacted]d with an Estimated Date of Delivery: 10/01/17   2) Initial OB visit  3) H/O short cx dx @ 23wks w/ term SVB last pregnancy> will monitor w/ serial CL u/s  4) Occ headaches> gave printed prevention/relief measures   5) BV> Rx metronidazole 500mg  BID x 7d for BV, no sex while taking   Meds:  Meds ordered this encounter  Medications  . metroNIDAZOLE (FLAGYL) 500 MG tablet    Sig: Take 1 tablet (500 mg total) by mouth 2 (two) times daily.     Dispense:  14 tablet    Refill:  0    Order Specific Question:   Supervising Provider    Answer:   Lazaro Arms [[id number]]    Initial labs obtained Continue prenatal vitamins Reviewed n/v relief measures and warning s/s to report Reviewed recommended weight gain based on pre-gravid BMI Encouraged well-balanced diet Genetic Screening discussed Integrated Screen: requested Cystic fibrosis screening discussed declined Ultrasound discussed; fetal survey: requested CCNC completed>PCM not here  Follow-up: Return in about 3 weeks (around 03/26/2017) for LROB, US:NT+1stIT.   Orders Placed This Encounter  Procedures  . Urine Culture  . Pain Management Screening Profile (10S)  . CBC  . Rubella screen  . RPR  . Urinalysis, Routine w reflex microscopic  . Hepatitis B surface antigen  . HIV antibody  . POCT urinalysis dipstick  . POCT Wet Prep [employer])  . ABO/Rh  . Antibody screen    Marge Duncans CNM, [facility] 03/04/2017 4:47 PM

## 2017-03-05 LAB — PMP SCREEN PROFILE (10S), URINE
AMPHETAMINE SCREEN URINE: NEGATIVE ng/mL
BARBITURATE SCREEN URINE: NEGATIVE ng/mL
BENZODIAZEPINE SCREEN, URINE: NEGATIVE ng/mL
CANNABINOIDS UR QL SCN: NEGATIVE ng/mL
COCAINE(METAB.)SCREEN, URINE: NEGATIVE ng/mL
CREATININE(CRT), U: 68 mg/dL (ref 20.0–300.0)
METHADONE SCREEN, URINE: NEGATIVE ng/mL
OPIATE SCREEN URINE: NEGATIVE ng/mL
OXYCODONE+OXYMORPHONE UR QL SCN: NEGATIVE ng/mL
PROPOXYPHENE SCREEN URINE: NEGATIVE ng/mL
Ph of Urine: 7 (ref 4.5–8.9)
Phencyclidine Qn, Ur: NEGATIVE ng/mL

## 2017-03-06 LAB — CBC
HEMATOCRIT: 34.4 % (ref 34.0–46.6)
Hemoglobin: 11.3 g/dL (ref 11.1–15.9)
MCH: 25.7 pg — AB (ref 26.6–33.0)
MCHC: 32.8 g/dL (ref 31.5–35.7)
MCV: 78 fL — AB (ref 79–97)
Platelets: 355 10*3/uL (ref 150–379)
RBC: 4.4 x10E6/uL (ref 3.77–5.28)
RDW: 14.8 % (ref 12.3–15.4)
WBC: 11.2 10*3/uL — AB (ref 3.4–10.8)

## 2017-03-06 LAB — URINALYSIS, ROUTINE W REFLEX MICROSCOPIC
BILIRUBIN UA: NEGATIVE
Glucose, UA: NEGATIVE
Ketones, UA: NEGATIVE
NITRITE UA: NEGATIVE
PH UA: 7.5 (ref 5.0–7.5)
Protein, UA: NEGATIVE
RBC, UA: NEGATIVE
SPEC GRAV UA: 1.013 (ref 1.005–1.030)
Urobilinogen, Ur: 1 mg/dL (ref 0.2–1.0)

## 2017-03-06 LAB — MICROSCOPIC EXAMINATION: CASTS: NONE SEEN /LPF

## 2017-03-06 LAB — AB SCR+ANTIBODY ID: Antibody Screen: POSITIVE — AB

## 2017-03-06 LAB — URINE CULTURE: Organism ID, Bacteria: NO GROWTH

## 2017-03-06 LAB — RPR: RPR Ser Ql: NONREACTIVE

## 2017-03-06 LAB — CYTOLOGY - PAP
CHLAMYDIA, DNA PROBE: NEGATIVE
DIAGNOSIS: NEGATIVE
NEISSERIA GONORRHEA: NEGATIVE

## 2017-03-06 LAB — HEPATITIS B SURFACE ANTIGEN: Hepatitis B Surface Ag: NEGATIVE

## 2017-03-06 LAB — ABO/RH: Rh Factor: POSITIVE

## 2017-03-06 LAB — HIV ANTIBODY (ROUTINE TESTING W REFLEX): HIV SCREEN 4TH GENERATION: NONREACTIVE

## 2017-03-06 LAB — RUBELLA SCREEN: Rubella Antibodies, IGG: 1.53 index (ref >0.99)

## 2017-03-06 LAB — ANTIBODY SCREEN

## 2017-03-24 ENCOUNTER — Other Ambulatory Visit: Payer: Self-pay | Admitting: Women's Health

## 2017-03-24 ENCOUNTER — Telehealth: Payer: Self-pay | Admitting: *Deleted

## 2017-03-24 DIAGNOSIS — Z3682 Encounter for antenatal screening for nuchal translucency: Secondary | ICD-10-CM

## 2017-03-24 NOTE — Telephone Encounter (Signed)
LMOVm returning patient's call. Advised to push fluids, wash hand frequently and clean surfaces. Also to take Tylenol to keep temp down if greater than 100.3. Advised to let us know if she is going on 5-7 days from start of symptoms.

## 2017-03-25 ENCOUNTER — Ambulatory Visit (INDEPENDENT_AMBULATORY_CARE_PROVIDER_SITE_OTHER): Payer: BLUE CROSS/BLUE SHIELD

## 2017-03-25 ENCOUNTER — Encounter: Payer: Self-pay | Admitting: Advanced Practice Midwife

## 2017-03-25 ENCOUNTER — Ambulatory Visit (INDEPENDENT_AMBULATORY_CARE_PROVIDER_SITE_OTHER): Payer: BLUE CROSS/BLUE SHIELD | Admitting: Advanced Practice Midwife

## 2017-03-25 VITALS — BP 100/60 | HR 94 | Wt 161.0 lb

## 2017-03-25 DIAGNOSIS — O350XX0 Maternal care for (suspected) central nervous system malformation in fetus, not applicable or unspecified: Secondary | ICD-10-CM

## 2017-03-25 DIAGNOSIS — Z3480 Encounter for supervision of other normal pregnancy, unspecified trimester: Secondary | ICD-10-CM

## 2017-03-25 DIAGNOSIS — R809 Proteinuria, unspecified: Secondary | ICD-10-CM | POA: Diagnosis not present

## 2017-03-25 DIAGNOSIS — Z1389 Encounter for screening for other disorder: Secondary | ICD-10-CM

## 2017-03-25 DIAGNOSIS — Z331 Pregnant state, incidental: Secondary | ICD-10-CM

## 2017-03-25 DIAGNOSIS — Z1379 Encounter for other screening for genetic and chromosomal anomalies: Secondary | ICD-10-CM | POA: Diagnosis not present

## 2017-03-25 DIAGNOSIS — Z3A12 12 weeks gestation of pregnancy: Secondary | ICD-10-CM

## 2017-03-25 DIAGNOSIS — Z3682 Encounter for antenatal screening for nuchal translucency: Secondary | ICD-10-CM

## 2017-03-25 DIAGNOSIS — Z3481 Encounter for supervision of other normal pregnancy, first trimester: Secondary | ICD-10-CM

## 2017-03-25 DIAGNOSIS — O09299 Supervision of pregnancy with other poor reproductive or obstetric history, unspecified trimester: Secondary | ICD-10-CM

## 2017-03-25 DIAGNOSIS — O09291 Supervision of pregnancy with other poor reproductive or obstetric history, first trimester: Secondary | ICD-10-CM

## 2017-03-25 DIAGNOSIS — O1211 Gestational proteinuria, first trimester: Secondary | ICD-10-CM

## 2017-03-25 LAB — POCT URINALYSIS DIPSTICK
GLUCOSE UA: NEGATIVE
Ketones, UA: NEGATIVE
NITRITE UA: NEGATIVE
PROTEIN UA: 1
RBC UA: NEGATIVE

## 2017-03-25 NOTE — Progress Notes (Signed)
US 12+6 wks,measurements c/w dates,crl 70.90 mm,fhr 157 bpm,bilat choroid plexus cysts,left 4.4 x 2.8 x 4.7 mm,right two cysts (#1) 4.7 x 2.1 x 3.4 (#2) 4.7 x 3.4 x 3.3 mm,NB present,NT 2 mm,anterior pl gr 0,normal ovaries bilat

## 2017-03-25 NOTE — Progress Notes (Signed)
G2P1001 4660w6d Estimated Date of Delivery: 10/01/17  Blood pressure 100/60, pulse 94, weight 161 lb (73 kg), last menstrual period 12/25/2016.   BP weight and urine results all reviewed and noted.  Please refer to the obstetrical flow sheet for the fundal height and fetal heart rate documentation:   US 12+6 wks,measurements c/w dates,crl 70.90 mm,fhr 157 bpm,bilat choroid plexus cysts,left 4.4 x 2.8 x 4.7 mm,right two cysts (#1) 4.7 x 2.1 x 3.4 (#2) 4.7 x 3.4 x 3.3 mm,NB present,NT 2 mm,anterior pl gr 0,normal ovaries bilat    Patient denies any bleeding and no rupture of membranes symptoms or regular contractions. Patient is without complaints. Denies UTI sx All questions were answered.  Orders Placed This Encounter  Procedures  . Urine Culture  . US OB Transvaginal  . Integrated 1  . POCT urinalysis dipstick    Plan:  Continued routine obstetrical care, cx length measurement q 2 weeks 16-24 weeks  Return in about 3 weeks (around 04/15/2017) for LROB, cx length US; after that q 2 week cx length US/LROB.

## 2017-03-27 LAB — URINE CULTURE

## 2017-03-30 ENCOUNTER — Telehealth: Payer: Self-pay | Admitting: Women's Health

## 2017-03-30 LAB — INTEGRATED 1
CROWN RUMP LENGTH MAT SCREEN: 70.9 mm
Gest. Age on Collection Date: 13.1 weeks
Maternal Age at EDD: 27.2 yr
Nuchal Translucency (NT): 2 mm
Number of Fetuses: 1
PAPP-A Value: 3448 ng/mL
Weight: 161 [lb_av]

## 2017-03-30 NOTE — Telephone Encounter (Signed)
Patient called stating that she has been with a headache for 3 days and she has been taking Tylenol Extra strength for it and it has not worked. Pt would like to know if she could get something else prescribed to her. Please contact pt

## 2017-03-31 ENCOUNTER — Encounter: Payer: Self-pay | Admitting: *Deleted

## 2017-04-13 ENCOUNTER — Telehealth: Payer: Self-pay | Admitting: Obstetrics & Gynecology

## 2017-04-13 NOTE — Telephone Encounter (Signed)
Pt called stating that last night during intercourse she started bleeding. She states that it was more than spotting. She states that it only happened that one time. Advised pt that can happen during pregnancy. She states she has had no bleeding since then. Advised pt to call back if the bleeding happens again unrelated to intercourse. Advised to wait 7 days from bleeding episode before resuming sexual activity. Pt verbalized understanding.

## 2017-04-15 ENCOUNTER — Encounter: Payer: Self-pay | Admitting: Women's Health

## 2017-04-15 ENCOUNTER — Ambulatory Visit (INDEPENDENT_AMBULATORY_CARE_PROVIDER_SITE_OTHER): Payer: BLUE CROSS/BLUE SHIELD | Admitting: Women's Health

## 2017-04-15 ENCOUNTER — Ambulatory Visit (INDEPENDENT_AMBULATORY_CARE_PROVIDER_SITE_OTHER): Payer: BLUE CROSS/BLUE SHIELD

## 2017-04-15 VITALS — BP 100/60 | HR 94 | Temp 99.0°F | Wt 163.4 lb

## 2017-04-15 DIAGNOSIS — O09292 Supervision of pregnancy with other poor reproductive or obstetric history, second trimester: Secondary | ICD-10-CM | POA: Diagnosis not present

## 2017-04-15 DIAGNOSIS — O09299 Supervision of pregnancy with other poor reproductive or obstetric history, unspecified trimester: Secondary | ICD-10-CM

## 2017-04-15 DIAGNOSIS — N898 Other specified noninflammatory disorders of vagina: Secondary | ICD-10-CM | POA: Diagnosis not present

## 2017-04-15 DIAGNOSIS — O26892 Other specified pregnancy related conditions, second trimester: Secondary | ICD-10-CM

## 2017-04-15 DIAGNOSIS — Z1389 Encounter for screening for other disorder: Secondary | ICD-10-CM

## 2017-04-15 DIAGNOSIS — O23592 Infection of other part of genital tract in pregnancy, second trimester: Secondary | ICD-10-CM | POA: Diagnosis not present

## 2017-04-15 DIAGNOSIS — Z3A15 15 weeks gestation of pregnancy: Secondary | ICD-10-CM

## 2017-04-15 DIAGNOSIS — Z331 Pregnant state, incidental: Secondary | ICD-10-CM

## 2017-04-15 DIAGNOSIS — O26872 Cervical shortening, second trimester: Secondary | ICD-10-CM

## 2017-04-15 DIAGNOSIS — O0992 Supervision of high risk pregnancy, unspecified, second trimester: Secondary | ICD-10-CM

## 2017-04-15 DIAGNOSIS — Z3482 Encounter for supervision of other normal pregnancy, second trimester: Secondary | ICD-10-CM

## 2017-04-15 DIAGNOSIS — Z3480 Encounter for supervision of other normal pregnancy, unspecified trimester: Secondary | ICD-10-CM

## 2017-04-15 DIAGNOSIS — Z1379 Encounter for other screening for genetic and chromosomal anomalies: Secondary | ICD-10-CM

## 2017-04-15 LAB — POCT WET PREP (WET MOUNT)
Clue Cells Wet Prep Whiff POC: POSITIVE
TRICHOMONAS WET PREP HPF POC: ABSENT

## 2017-04-15 MED ORDER — PROGESTERONE MICRONIZED 200 MG PO CAPS: 200.0000 mg | ORAL_CAPSULE | Freq: Every day | ORAL | 5 refills | Status: DC

## 2017-04-15 MED ORDER — METRONIDAZOLE 500 MG PO TABS: 500.0000 mg | ORAL_TABLET | Freq: Two times a day (BID) | ORAL | 0 refills | Status: DC

## 2017-04-15 NOTE — Progress Notes (Signed)
HIGH-RISK PREGNANCY VISIT Patient name: Marie Rivera MRN [id number]  Date of birth: 12-21-1990 Chief Complaint:   Routine Prenatal Visit (ultrasound/2 nd IT)  History of Present Illness:   Marie Rivera is a 27 y.o. G36P1001 female at [redacted]w[redacted]d with an Estimated Date of Delivery: 10/01/17 being seen today for ongoing management of a high-risk pregnancy complicated by short cx dx today.  Today she reports vaginal discharge, bleeding the other day w/ sex. 3yo son sick w/ 102 fever, took him to ED overnight, didn't swab for flu. Pt feeling a little 'off', no acute sx right now.  .  .  Movement: Absent. denies leaking of fluid.  Review of Systems:   Pertinent items are noted in HPI Denies abnormal vaginal discharge w/ itching/odor/irritation, headaches, visual changes, shortness of breath, chest pain, abdominal pain, severe nausea/vomiting, or problems with urination or bowel movements unless otherwise stated above. Pertinent History Reviewed:  Reviewed past medical,surgical, social, obstetrical and family history.  Reviewed problem list, medications and allergies. Physical Assessment:   Vitals:   04/15/17 1348  BP: 100/60  Pulse: 94  Temp: 99 F (37.2 C)  Weight: 163 lb 6.4 oz (74.1 kg)  Body mass index is 29.89 kg/m.           Physical Examination:   General appearance: alert, well appearing, and in no distress  Mental status: alert, oriented to person, place, and time  Skin: warm & dry   Extremities: Edema: None    Cardiovascular: normal heart rate noted  Respiratory: normal respiratory effort, no distress  Abdomen: gravid, soft, non-tender  Pelvic: spec exam: cx visually closed, sm amt thin yellow d/c w/ slight odor         Fetal Status: Fetal Heart Rate (bpm): 155 u/s   Movement: Absent    Fetal Surveillance Testing today:  [place] 15+6 wks,cephalic,anterior pl gr 0,svp of fluid 4.6 cm,bilat adnexa's wnl,fhr 155 bpm,cx length 2.5 - 2.8 w/ and w/o pressure,amniotic sludge  visualized  Results for orders placed or performed in visit on 04/15/17 (from the past 24 hour(s))  POCT Wet Prep Mellody [facility]rown [place])   Collection Time: 04/15/17  2:54 PM  Result Value Ref Range   Source Wet Prep POC vaginal    WBC, Wet Prep HPF POC few    Bacteria Wet Prep HPF POC Few Few   BACTERIA WET PREP MORPHOLOGY POC     Clue Cells Wet Prep HPF POC Moderate (A) None   Clue Cells Wet Prep Whiff POC Positive Whiff    Yeast Wet Prep HPF POC None    KOH Wet Prep POC     Trichomonas Wet Prep HPF POC Absent Absent    Assessment & Plan:  1) High-risk pregnancy G2P1001 at [redacted]w[redacted]d with an Estimated Date of Delivery: 10/01/17   2) Short cx, dx today, 2.5-2.8cm w/ amniotic sludge. Discussed w/ LHE, cerclage no better outcomes than prometrium. Rx prometrium 200mg  pv qhs. Pelvic rest for now. Repeat CL 1wk. 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 progesterone (PROMETRIUM) 200 MG capsule    Sig: Place 1 capsule (200 mg total) vaginally at bedtime.    Dispense:  30 capsule    Refill:  5    Order Specific Question:  Supervising Provider    Answer:   Duane LopeEURE, LUTHER H [[id number]]  . metroNIDAZOLE (FLAGYL) 500 MG tablet    Sig: Take 1 tablet (500 mg total) by mouth 2 (two) times daily.    Dispense:  14 tablet    Refill:  0    Order Specific Question:   Supervising Provider    Answer:   Lazaro ArmsEURE, LUTHER H [[id number]]    Labs/procedures today: 2nd IT, u/s, spec exam/wet prep  Treatment Plan:  CL q1wk, prometrium q hs  Reviewed: Preterm labor symptoms and general obstetric precautions including but not limited to vaginal bleeding, contractions, leaking of fluid and fetal movement were reviewed in detail with the patient.  All questions were answered.  Follow-up: Return in 1 week (on 04/22/2017) for  US:OB F/U cervix, HROB.  Orders Placed This Encounter  Procedures  . US OB Transvaginal  . INTEGRATED 2  . POCT urinalysis dipstick  . POCT Wet Prep [facility]   Cheral MarkerKimberly R Rorik Vespa CNM, [facility] 04/15/2017 2:57 PM

## 2017-04-15 NOTE — Patient Instructions (Signed)
Marie Rivera, I greatly value your feedback.  If you receive a survey following your visit with us today, we appreciate you taking the time to fill it out.  Thanks, Marie Rivera, CNM, WHNP-BC   Second Trimester of Pregnancy The second trimester is from week 14 through week 27 (months 4 through 6). The second trimester is often a time when you feel your best. Your body has adjusted to being pregnant, and you begin to feel better physically. Usually, morning sickness has lessened or quit completely, you may have more energy, and you may have an increase in appetite. The second trimester is also a time when the fetus is growing rapidly. At the end of the sixth month, the fetus is about 9 inches long and weighs about 1 pounds. You will likely begin to feel the baby move (quickening) between 16 and 20 weeks of pregnancy. Body changes during your second trimester Your body continues to go through many changes during your second trimester. The changes vary from woman to woman.  Your weight will continue to increase. You will notice your lower abdomen bulging out.  You may begin to get stretch marks on your hips, abdomen, and breasts.  You may develop headaches that can be relieved by medicines. The medicines should be approved by your health care provider.  You may urinate more often because the fetus is pressing on your bladder.  You may develop or continue to have heartburn as a result of your pregnancy.  You may develop constipation because certain hormones are causing the muscles that push waste through your intestines to slow down.  You may develop hemorrhoids or swollen, bulging veins (varicose veins).  You may have back pain. This is caused by: ? Weight gain. ? Pregnancy hormones that are relaxing the joints in your pelvis. ? A shift in weight and the muscles that support your balance.  Your breasts will continue to grow and they will continue to become tender.  Your gums may bleed  and may be sensitive to brushing and flossing.  Dark spots or blotches (chloasma, mask of pregnancy) may develop on your face. This will likely fade after the baby is born.  A dark line from your belly button to the pubic area (linea nigra) may appear. This will likely fade after the baby is born.  You may have changes in your hair. These can include thickening of your hair, rapid growth, and changes in texture. Some women also have hair loss during or after pregnancy, or hair that feels dry or thin. Your hair will most likely return to normal after your baby is born.  What to expect at prenatal visits During a routine prenatal visit:  You will be weighed to make sure you and the fetus are growing normally.  Your blood pressure will be taken.  Your abdomen will be measured to track your baby's growth.  The fetal heartbeat will be listened to.  Any test results from the previous visit will be discussed.  Your health care provider may ask you:  How you are feeling.  If you are feeling the baby move.  If you have had any abnormal symptoms, such as leaking fluid, bleeding, severe headaches, or abdominal cramping.  If you are using any tobacco products, including cigarettes, chewing tobacco, and electronic cigarettes.  If you have any questions.  Other tests that may be performed during your second trimester include:  Blood tests that check for: ? Low iron levels (anemia). ? High  blood sugar that affects pregnant women (gestational diabetes) between 3 and 28 weeks. ? Rh antibodies. This is to check for a protein on red blood cells (Rh factor).  Urine tests to check for infections, diabetes, or protein in the urine.  An ultrasound to confirm the proper growth and development of the baby.  An amniocentesis to check for possible genetic problems.  Fetal screens for spina bifida and Down syndrome.  HIV (human immunodeficiency virus) testing. Routine prenatal testing includes  screening for HIV, unless you choose not to have this test.  Follow these instructions at home: Medicines  Follow your health care provider's instructions regarding medicine use. Specific medicines may be either safe or unsafe to take during pregnancy.  Take a prenatal vitamin that contains at least 600 micrograms (mcg) of folic acid.  If you develop constipation, try taking a stool softener if your health care provider approves. Eating and drinking  Eat a balanced diet that includes fresh fruits and vegetables, whole grains, good sources of protein such as meat, eggs, or tofu, and low-fat dairy. Your health care provider will help you determine the amount of weight gain that is right for you.  Avoid raw meat and uncooked cheese. These carry germs that can cause birth defects in the baby.  If you have low calcium intake from food, talk to your health care provider about whether you should take a daily calcium supplement.  Limit foods that are high in fat and processed sugars, such as fried and sweet foods.  To prevent constipation: ? Drink enough fluid to keep your urine clear or pale yellow. ? Eat foods that are high in fiber, such as fresh fruits and vegetables, whole grains, and beans. Activity  Exercise only as directed by your health care provider. Most women can continue their usual exercise routine during pregnancy. Try to exercise for 30 minutes at least 5 days a week. Stop exercising if you experience uterine contractions.  Avoid heavy lifting, wear low heel shoes, and practice good posture.  A sexual relationship may be continued unless your health care provider directs you otherwise. Relieving pain and discomfort  Wear a good support bra to prevent discomfort from breast tenderness.  Take warm sitz baths to soothe any pain or discomfort caused by hemorrhoids. Use hemorrhoid cream if your health care provider approves.  Rest with your legs elevated if you have leg cramps  or low back pain.  If you develop varicose veins, wear support hose. Elevate your feet for 15 minutes, 3-4 times a day. Limit salt in your diet. Prenatal Care  Write down your questions. Take them to your prenatal visits.  Keep all your prenatal visits as told by your health care provider. This is important. Safety  Wear your seat belt at all times when driving.  Make a list of emergency phone numbers, including numbers for family, friends, the hospital, and police and fire departments. General instructions  Ask your health care provider for a referral to a local prenatal education class. Begin classes no later than the beginning of month 6 of your pregnancy.  Ask for help if you have counseling or nutritional needs during pregnancy. Your health care provider can offer advice or refer you to specialists for help with various needs.  Do not use hot tubs, steam rooms, or saunas.  Do not douche or use tampons or scented sanitary pads.  Do not cross your legs for long periods of time.  Avoid cat litter boxes and soil  used by cats. These carry germs that can cause birth defects in the baby and possibly loss of the fetus by miscarriage or stillbirth.  Avoid all smoking, herbs, alcohol, and unprescribed drugs. Chemicals in these products can affect the formation and growth of the baby.  Do not use any products that contain nicotine or tobacco, such as cigarettes and e-cigarettes. If you need help quitting, ask your health care provider.  Visit your dentist if you have not gone yet during your pregnancy. Use a soft toothbrush to brush your teeth and be gentle when you floss. Contact a health care provider if:  You have dizziness.  You have mild pelvic cramps, pelvic pressure, or nagging pain in the abdominal area.  You have persistent nausea, vomiting, or diarrhea.  You have a bad smelling vaginal discharge.  You have pain when you urinate. Get help right away if:  You have a  fever.  You are leaking fluid from your vagina.  You have spotting or bleeding from your vagina.  You have severe abdominal cramping or pain.  You have rapid weight gain or weight loss.  You have shortness of breath with chest pain.  You notice sudden or extreme swelling of your face, hands, ankles, feet, or legs.  You have not felt your baby move in over an hour.  You have severe headaches that do not go away when you take medicine.  You have vision changes. Summary  The second trimester is from week 14 through week 27 (months 4 through 6). It is also a time when the fetus is growing rapidly.  Your body goes through many changes during pregnancy. The changes vary from woman to woman.  Avoid all smoking, herbs, alcohol, and unprescribed drugs. These chemicals affect the formation and growth your baby.  Do not use any tobacco products, such as cigarettes, chewing tobacco, and e-cigarettes. If you need help quitting, ask your health care provider.  Contact your health care provider if you have any questions. Keep all prenatal visits as told by your health care provider. This is important. This information is not intended to replace advice given to you by your health care provider. Make sure you discuss any questions you have with your health care provider. Document Released: 01/21/2001 Document Revised: 07/05/2015 Document Reviewed: 03/30/2012 Elsevier Interactive Patient Education  2017 [employer].

## 2017-04-15 NOTE — Progress Notes (Signed)
US 15+6 wks,cephalic,anterior pl gr 0,svp of fluid 4.6 cm,bilat adnexa's wnl,fhr 155 bpm,cx length 2.5 - 2.8 w/pressure,amniotic sludge visualized

## 2017-04-17 LAB — INTEGRATED 2
ADSF: 1.09
AFP MOM: 1.67
Alpha-Fetoprotein: 53.9 ng/mL
Crown Rump Length: 70.9 mm
DIA MoM: 1.08
DIA VALUE: 175.6 pg/mL
Estriol, Unconjugated: 0.87 ng/mL
GEST. AGE ON COLLECTION DATE: 13.1 wk
Gestational Age: 16.1 weeks
Maternal Age at EDD: 27.2 yr
NUMBER OF FETUSES: 1
Nuchal Translucency (NT): 2 mm
Nuchal Translucency MoM: 1.14
PAPP-A MOM: 3.01
PAPP-A Value: 3448 ng/mL
TEST RESULTS: NEGATIVE
WEIGHT: 161 [lb_av]
WEIGHT: 161 [lb_av]
hCG MoM: 1.13
hCG Value: 36.1 IU/mL

## 2017-04-21 ENCOUNTER — Emergency Department (HOSPITAL_COMMUNITY)
Admission: EM | Admit: 2017-04-21 | Discharge: 2017-04-21 | Disposition: A | Discharge status: Home / Self Care | Payer: BLUE CROSS/BLUE SHIELD | Attending: Emergency Medicine | Admitting: Emergency Medicine

## 2017-04-21 ENCOUNTER — Other Ambulatory Visit: Payer: Self-pay

## 2017-04-21 ENCOUNTER — Encounter (HOSPITAL_COMMUNITY): Payer: Self-pay | Admitting: Emergency Medicine

## 2017-04-21 DIAGNOSIS — Z87891 Personal history of nicotine dependence: Secondary | ICD-10-CM | POA: Insufficient documentation

## 2017-04-21 DIAGNOSIS — Z3A15 15 weeks gestation of pregnancy: Secondary | ICD-10-CM | POA: Diagnosis not present

## 2017-04-21 DIAGNOSIS — R1030 Lower abdominal pain, unspecified: Secondary | ICD-10-CM | POA: Diagnosis not present

## 2017-04-21 DIAGNOSIS — Z79899 Other long term (current) drug therapy: Secondary | ICD-10-CM | POA: Insufficient documentation

## 2017-04-21 DIAGNOSIS — O26892 Other specified pregnancy related conditions, second trimester: Secondary | ICD-10-CM | POA: Insufficient documentation

## 2017-04-21 DIAGNOSIS — Z3A16 16 weeks gestation of pregnancy: Secondary | ICD-10-CM | POA: Insufficient documentation

## 2017-04-21 DIAGNOSIS — R509 Fever, unspecified: Secondary | ICD-10-CM | POA: Insufficient documentation

## 2017-04-21 LAB — URINALYSIS, ROUTINE W REFLEX MICROSCOPIC
BILIRUBIN URINE: NEGATIVE
Glucose, UA: NEGATIVE mg/dL
Hgb urine dipstick: NEGATIVE
Ketones, ur: NEGATIVE mg/dL
Nitrite: NEGATIVE
PH: 7 (ref 5.0–8.0)
Protein, ur: NEGATIVE mg/dL
SPECIFIC GRAVITY, URINE: 1.015 (ref 1.005–1.030)

## 2017-04-21 MED ORDER — ACETAMINOPHEN 500 MG PO TABS
1000.0000 mg | ORAL_TABLET | Freq: Once | ORAL | Status: AC
Start: 2017-04-21 — End: 2017-04-21
  Administered 2017-04-21: 1000 mg via ORAL

## 2017-04-21 MED ORDER — ACETAMINOPHEN 500 MG PO TABS
ORAL_TABLET | ORAL | Status: AC
  Filled 2017-04-21: qty 2

## 2017-04-21 NOTE — Discharge Instructions (Signed)
Take Tylenol 650 mg every 4 hours as needed for aches or for temperature higher than 100.4.  [facility].Make sure that you drink at least six 8 ounce glasses of water or Gatorade each day in order to stay well-hydrated.

## 2017-04-21 NOTE — ED Triage Notes (Signed)
Pt c/o fever x 3-4 days with lower abd pain. Pt states she is  4 months pregnant.

## 2017-04-21 NOTE — ED Provider Notes (Addendum)
[facility] EMERGENCY DEPARTMENT Provider Note   CSN: [id number] Arrival date & time: 04/21/17  1939     History   Chief Complaint Chief Complaint  Patient presents with  . Fever    HPI Marie Rivera is a 27 y.o. female.  HPI  History reviewed. No pertinent past medical history.  Complains of generalized weakness for the past 4-5 days accompanied by fever of 101.  She also admits to cough and nasal congestion.  She denies shortness of breath.  She had lower abdominal pain this afternoon which lasted for a few minutes and resolve spontaneously.  She denies urinary symptoms denies vaginal discharge or bleeding.  Treated with Tylenol prior to my exam today, feels improved presently.  Other associated symptoms include generalized weakness worse with standing improved with lying down.  Patient Active Problem List   Diagnosis Date Noted  . Supervision of other normal pregnancy, antepartum 03/04/2017  . History of prior pregnancy with short cervix, currently pregnant 04/18/2013    History reviewed. No pertinent surgical history.  OB History    Gravida Para Term Preterm AB Living   2 1 1     1    SAB TAB Ectopic Multiple Live Births           1       Home Medications    Prior to Admission medications   Medication Sig Start Date End Date Taking? Authorizing Provider  acetaminophen (TYLENOL) 325 MG tablet Take 650 mg by mouth every 6 (six) hours as needed for mild pain.     [provider]  brompheniramine-pseudoephedrine-DM 30-2-10 MG/5ML syrup Take by mouth 4 (four) times daily as needed.    [provider]  dextromethorphan-guaiFENesin (ROBITUSSIN-DM) 10-100 MG/5ML liquid Take by mouth every 4 (four) hours as needed for cough.    [provider]  metroNIDAZOLE (FLAGYL) 500 MG tablet Take 1 tablet (500 mg total) by mouth 2 (two) times daily. 04/15/17   Cheral Marker, CNM  Prenatal MV-Min-FA-Omega-3 (PRENATAL GUMMIES/DHA & FA) 0.4-32.5 MG CHEW  Chew 2 tablets by mouth daily. 02/11/17   Adline Potter, NP  progesterone (PROMETRIUM) 200 MG capsule Place 1 capsule (200 mg total) vaginally at bedtime. 04/15/17   Cheral Marker, CNM    Family History Family History  Problem Relation Age of Onset  . Diabetes Mother   . Hypertension Mother   . Diabetes Maternal Grandmother   . Cancer Maternal Aunt   . Cancer Paternal Aunt     Social History Social History   Tobacco Use  . Smoking status: Former Smoker    Packs/day: 0.02    Years: 2.00    Pack years: 0.04    Types: Cigarettes  . Smokeless tobacco: Never Used  Substance Use Topics  . Alcohol use: No  . Drug use: No     Allergies   Patient has no known allergies.   Review of Systems Review of Systems  Constitutional: Positive for fever.  HENT: Negative.   Respiratory: Positive for cough. Negative for shortness of breath.   Cardiovascular: Negative.   Gastrointestinal: Positive for abdominal pain.  Genitourinary:       Pregnant  Musculoskeletal: Negative.   Skin: Negative.   Neurological: Negative.   Psychiatric/Behavioral: Negative.   All other systems reviewed and are negative.    Physical Exam Updated Vital Signs BP 116/68 (BP Location: Right Arm)   Pulse (!) 123   Temp (!) 101.1 F (38.4 C) (Oral)  Resp 17   LMP 12/25/2016 (Exact Date)   SpO2 100%   Physical Exam  Constitutional: She is oriented to person, place, and time. She appears well-developed and well-nourished. No distress.  HENT:  Head: Normocephalic and atraumatic.  Oropharynx minimally reddened no exudate.  Uvula midline.  Eyes: Conjunctivae are normal. Pupils are equal, round, and reactive to light.  Neck: Neck supple. No tracheal deviation present. No thyromegaly present.  Cardiovascular: Normal rate and regular rhythm.  No murmur heard. Pulmonary/Chest: Effort normal and breath sounds normal.  Abdominal: Soft. Bowel sounds are normal. She exhibits no distension. There is no  tenderness.  Fetal heart tones 168  Musculoskeletal: Normal range of motion. She exhibits no edema or tenderness.  Neurological: She is alert and oriented to person, place, and time. Coordination normal.  Gait normal not lightheaded on standing  Skin: Skin is warm and dry. Capillary refill takes less than 2 seconds. No rash noted.  Psychiatric: She has a normal mood and affect.  Nursing note and vitals reviewed.    ED Treatments / Results  Labs (all labs ordered are listed, but only abnormal results are displayed) Labs Reviewed  COMPREHENSIVE METABOLIC PANEL  CBC WITH DIFFERENTIAL/PLATELET  URINALYSIS, ROUTINE W REFLEX MICROSCOPIC  LACTIC ACID, PLASMA  LACTIC ACID, PLASMA    EKG  EKG Interpretation None       Radiology No results found.  Procedures Procedures (including critical care time)  Medications Ordered in ED Medications  acetaminophen (TYLENOL) tablet 1,000 mg (1,000 mg Oral Given 04/21/17 1953)   Declined chest x-ray.  I am fine with that.  Patient likely has viral respiratory illness.  She has follow-up with her GYN doctor tomorrow.  Encouraged to keep scheduled appointment.  Initial Impression / Assessment and Plan / ED Course  I have reviewed the triage vital signs and the nursing notes.  Pertinent labs & imaging results that were available during my care of the patient were reviewed by me and considered in my medical decision making (see chart for details).     Plan Tylenol.  Oral hydration.  Urine sent for culture.  She does not clinically dehydrated and not ill-appearing Final Clinical Impressions(s) / ED Diagnoses  Diagnosis febrile illness Final diagnoses:  None    ED Discharge Orders    None       Doug SouJacubowitz, Ashleyann Shoun, MD 04/21/17 2201    Doug SouJacubowitz, Christionna Poland, MD 04/21/17 2202

## 2017-04-22 ENCOUNTER — Ambulatory Visit (INDEPENDENT_AMBULATORY_CARE_PROVIDER_SITE_OTHER): Payer: BLUE CROSS/BLUE SHIELD | Admitting: Obstetrics and Gynecology

## 2017-04-22 ENCOUNTER — Encounter: Payer: Self-pay | Admitting: Obstetrics and Gynecology

## 2017-04-22 ENCOUNTER — Ambulatory Visit (INDEPENDENT_AMBULATORY_CARE_PROVIDER_SITE_OTHER): Payer: BLUE CROSS/BLUE SHIELD

## 2017-04-22 VITALS — BP 80/50 | HR 96 | Temp 98.4°F | Wt 161.4 lb

## 2017-04-22 DIAGNOSIS — O0992 Supervision of high risk pregnancy, unspecified, second trimester: Secondary | ICD-10-CM

## 2017-04-22 DIAGNOSIS — Z3A16 16 weeks gestation of pregnancy: Secondary | ICD-10-CM | POA: Diagnosis not present

## 2017-04-22 DIAGNOSIS — Z331 Pregnant state, incidental: Secondary | ICD-10-CM

## 2017-04-22 DIAGNOSIS — Z3480 Encounter for supervision of other normal pregnancy, unspecified trimester: Secondary | ICD-10-CM

## 2017-04-22 DIAGNOSIS — O26872 Cervical shortening, second trimester: Secondary | ICD-10-CM | POA: Diagnosis not present

## 2017-04-22 DIAGNOSIS — O361990 Maternal care for other isoimmunization, unspecified trimester, not applicable or unspecified: Secondary | ICD-10-CM | POA: Insufficient documentation

## 2017-04-22 DIAGNOSIS — O09299 Supervision of pregnancy with other poor reproductive or obstetric history, unspecified trimester: Secondary | ICD-10-CM

## 2017-04-22 DIAGNOSIS — J069 Acute upper respiratory infection, unspecified: Secondary | ICD-10-CM

## 2017-04-22 DIAGNOSIS — Z1389 Encounter for screening for other disorder: Secondary | ICD-10-CM

## 2017-04-22 DIAGNOSIS — O09292 Supervision of pregnancy with other poor reproductive or obstetric history, second trimester: Secondary | ICD-10-CM

## 2017-04-22 DIAGNOSIS — O99412 Diseases of the circulatory system complicating pregnancy, second trimester: Secondary | ICD-10-CM

## 2017-04-22 DIAGNOSIS — Z3482 Encounter for supervision of other normal pregnancy, second trimester: Secondary | ICD-10-CM

## 2017-04-22 LAB — POCT URINALYSIS DIPSTICK
Glucose, UA: NEGATIVE
Ketones, UA: NEGATIVE
LEUKOCYTES UA: NEGATIVE
Nitrite, UA: NEGATIVE
RBC UA: NEGATIVE

## 2017-04-22 MED ORDER — OSELTAMIVIR PHOSPHATE 75 MG PO CAPS: 75.0000 mg | ORAL_CAPSULE | Freq: Every day | ORAL | 0 refills | Status: DC

## 2017-04-22 MED ORDER — HYDROCODONE-HOMATROPINE 5-1.5 MG/5ML PO SYRP: 5.0000 mL | ORAL_SOLUTION | Freq: Four times a day (QID) | ORAL | 0 refills | Status: DC | PRN

## 2017-04-22 NOTE — Progress Notes (Signed)
US TA/TV 16+6 wks,cephalic,cx 2.5 cm with and w/o pressure,amnionic sludge,normal ovaries bilat,anterior pl gr 0,fhr 141 bpm,SVP of fluid 3.1 cm

## 2017-04-22 NOTE — Progress Notes (Signed)
HIGH-RISK PREGNANCY VISIT Patient name: Marie Rivera MRN [id number]  Date of birth: December 26, 1990 Chief Complaint:   Routine Prenatal Visit (seem ER 04-21-17 head, nasal congestion)  History of Present Illness:   Marie Rivera is a 27 y.o. 112P1001 female at 7525w6d with an Estimated Date of Delivery: 10/01/17 being seen today for ongoing management of a high-risk pregnancy complicated by short cervix. Today she reports nasal congestion and flu-like symptoms, with cough congestion, headache, and malaise. She went to the ED yesterday for generalized weakness, nasal congestion and a fever.  .  .  Movement: Absent. denies leaking of fluid.  Review of Systems:   Pertinent items are noted in HPI Denies abnormal vaginal discharge w/ itching/odor/irritation, headaches, visual changes, shortness of breath, chest pain, abdominal pain, severe nausea/vomiting, or problems with urination or bowel movements unless otherwise stated above. Pertinent History Reviewed:  Reviewed past medical,surgical, social, obstetrical and family history.  Reviewed problem list, medications and allergies. Physical Assessment:   Vitals:   04/22/17 1215  BP: (!) 80/50  Pulse: 96  Temp: 98.4 F (36.9 C)  Weight: 161 lb 6.4 oz (73.2 kg)  Body mass index is 29.52 kg/m.           Physical Examination:   General appearance: alert, well appearing, and in no distress, oriented to person, place, and time and normal appearing weight  Mental status: alert, oriented to person, place, and time, normal mood, behavior, speech, dress, motor activity, and thought processes  Skin: warm & dry   Extremities: Edema: None    Cardiovascular: normal heart rate noted  Respiratory: normal respiratory effort, no distress  Abdomen: gravid, soft, non-tender  Pelvic: 2.5 cm cervix         Fetal Status: Fetal Heart Rate (bpm): +u/ 141   Movement: Absent    Fetal Surveillance Testing today: U/S for cervix length  Results for orders placed  or performed in visit on 04/22/17 (from the past 24 hour(s))  POCT urinalysis dipstick   Collection Time: 04/22/17 12:21 PM  Result Value Ref Range   Color, UA     Clarity, UA     Glucose, UA neg    Bilirubin, UA     Ketones, UA neg    Spec Grav, UA  1.010 - 1.025   Blood, UA neg    pH, UA  5.0 - 8.0   Protein, UA trace    Urobilinogen, UA  0.2 or 1.0 E.U./dL   Nitrite, UA neg    Leukocytes, UA Negative Negative   Appearance     Odor    Results for orders placed or performed during the hospital encounter of 04/21/17 (from the past 24 hour(s))  Urinalysis, Routine w reflex microscopic   Collection Time: 04/21/17  7:52 PM  Result Value Ref Range   Color, Urine YELLOW YELLOW   APPearance CLOUDY (A) CLEAR   Specific Gravity, Urine 1.015 1.005 - 1.030   pH 7.0 5.0 - 8.0   Glucose, UA NEGATIVE NEGATIVE mg/dL   Hgb urine dipstick NEGATIVE NEGATIVE   Bilirubin Urine NEGATIVE NEGATIVE   Ketones, ur NEGATIVE NEGATIVE mg/dL   Protein, ur NEGATIVE NEGATIVE mg/dL   Nitrite NEGATIVE NEGATIVE   Leukocytes, UA LARGE (A) NEGATIVE   RBC / HPF 0-5 0 - 5 RBC/hpf   WBC, UA 6-30 0 - 5 WBC/hpf   Bacteria, UA RARE (A) NONE SEEN   Squamous Epithelial / LPF 6-30 (A) NONE SEEN   Mucus PRESENT  Assessment & Plan:  1) High-risk pregnancy G2P1001 at [redacted]w[redacted]d with an Estimated Date of Delivery: 10/01/17  Hx preterm cervical shortening with term delivery 2) Short Cervix, 2.5 cm, stable, taking Prometrium 200 mg pv qhs  3) Nasal Congestion, URI,  was given 1000mg  tylenol yesterday and is using a nasal spray, Mucinex and Robitussin DM, advised to take sudafed/claritin/zyrtec as well.  Meds: Prometrium 200 mg pv qhs, Prescribe 75 mg x 5days Tamiflu, [place] prescribed for possible FLU  Labs/procedures today: U/S  Treatment Plan:   CL q1wk, prometrium q hs  Reviewed: Preterm labor symptoms and general obstetric precautions including but not limited to vaginal bleeding, contractions, leaking of fluid  and fetal movement were reviewed in detail with the patient.  All questions were answered.  Follow-up: Return in about 2 weeks (around 05/06/2017), or if symptoms worsen or fail to improve, for HROB, U/S: OB CL.  Orders Placed This Encounter  Procedures  . POCT urinalysis dipstick    By signing my name below, I, Izna Ahmed, attest that this documentation has been prepared under the direction and in the presence of Tilda Burrow, MD. Electronically Signed: Redge Gainer, Medical Scribe. 04/22/17. 12:47 PM.  I personally performed the services described in this documentation, which was SCRIBED in my presence. The recorded information has been reviewed and considered accurate. It has been edited as necessary during review. Tilda Burrow, MD

## 2017-04-22 NOTE — Assessment & Plan Note (Signed)
Low risk no need for f/u titers

## 2017-04-23 LAB — URINE CULTURE: Special Requests: NORMAL

## 2017-04-29 ENCOUNTER — Other Ambulatory Visit: Payer: BLUE CROSS/BLUE SHIELD

## 2017-04-29 ENCOUNTER — Encounter: Payer: BLUE CROSS/BLUE SHIELD | Admitting: Obstetrics and Gynecology

## 2017-04-30 ENCOUNTER — Telehealth: Payer: Self-pay | Admitting: *Deleted

## 2017-04-30 NOTE — Telephone Encounter (Signed)
  Representative from [employer] called in regards to patient needing to be out of work for pelvic rest due to short cervix. Requesting office notes be sent and asking if patient can work on light duty. Informed that patient would need to be assessed at her next visit in order to know if she can return on light duty. Notes to be sent.

## 2017-05-06 ENCOUNTER — Other Ambulatory Visit: Payer: Self-pay | Admitting: Advanced Practice Midwife

## 2017-05-06 ENCOUNTER — Ambulatory Visit (INDEPENDENT_AMBULATORY_CARE_PROVIDER_SITE_OTHER): Payer: BLUE CROSS/BLUE SHIELD | Admitting: Obstetrics and Gynecology

## 2017-05-06 ENCOUNTER — Ambulatory Visit (INDEPENDENT_AMBULATORY_CARE_PROVIDER_SITE_OTHER): Payer: BLUE CROSS/BLUE SHIELD

## 2017-05-06 VITALS — BP 94/60 | HR 98 | Wt 166.0 lb

## 2017-05-06 DIAGNOSIS — Z363 Encounter for antenatal screening for malformations: Secondary | ICD-10-CM

## 2017-05-06 DIAGNOSIS — Z331 Pregnant state, incidental: Secondary | ICD-10-CM

## 2017-05-06 DIAGNOSIS — O09292 Supervision of pregnancy with other poor reproductive or obstetric history, second trimester: Secondary | ICD-10-CM

## 2017-05-06 DIAGNOSIS — O09299 Supervision of pregnancy with other poor reproductive or obstetric history, unspecified trimester: Secondary | ICD-10-CM

## 2017-05-06 DIAGNOSIS — Z3A18 18 weeks gestation of pregnancy: Secondary | ICD-10-CM

## 2017-05-06 DIAGNOSIS — O26872 Cervical shortening, second trimester: Secondary | ICD-10-CM

## 2017-05-06 DIAGNOSIS — Z3480 Encounter for supervision of other normal pregnancy, unspecified trimester: Secondary | ICD-10-CM

## 2017-05-06 DIAGNOSIS — Z1389 Encounter for screening for other disorder: Secondary | ICD-10-CM

## 2017-05-06 LAB — POCT URINALYSIS DIPSTICK
Blood, UA: NEGATIVE
GLUCOSE UA: NEGATIVE
KETONES UA: NEGATIVE
Leukocytes, UA: NEGATIVE
Nitrite, UA: NEGATIVE
Protein, UA: NEGATIVE

## 2017-05-06 NOTE — Progress Notes (Signed)
HIGH-RISK PREGNANCY VISIT Patient name: Marie Rivera MRN [id number]  Date of birth: [birth year] Chief Complaint:   Routine Prenatal Visit ([place] today)  History of Present Illness:   Marie Rivera is a 27 y.o. G49P1001 female at [redacted]w[redacted]d with an Estimated Date of Delivery: 10/01/17 being seen today for ongoing management of a high-risk pregnancy complicated by short cervix.   Today she reports no complaints.  .  Vag. Bleeding: None.  Movement: Present. denies leaking of fluid.  Review of Systems:   Pertinent items are noted in HPI Denies abnormal vaginal discharge w/ itching/odor/irritation, headaches, visual changes, shortness of breath, chest pain, abdominal pain, severe nausea/vomiting, or problems with urination or bowel movements unless otherwise stated above. Pertinent History Reviewed:  Reviewed past medical,surgical, social, obstetrical and family history.  Reviewed problem list, medications and allergies. Physical Assessment:   Vitals:   05/06/17 1420  BP: 94/60  Pulse: 98  Weight: 166 lb (75.3 kg)  Body mass index is 30.36 kg/m.           Physical Examination:   General appearance: alert, well appearing, and in no distress and oriented to person, place, and time  Mental status: alert, oriented to person, place, and time, normal mood, behavior, speech, dress, motor activity, and thought processes  Skin: warm & dry   Extremities: Edema: None    Cardiovascular: normal heart rate noted  Respiratory: normal respiratory effort, no distress  Abdomen: gravid, soft, non-tender  Pelvic: Cervical exam deferred         Fetal Status: Fetal Heart Rate (bpm): +u/s 146   Movement: Present    Fetal Surveillance Testing today: U/S which reveals unchanged cervix 2.6 cm in length present  Results for orders placed or performed in visit on 05/06/17 (from the past 24 hour(s))  POCT Urinalysis Dipstick   Collection Time: 05/06/17  2:23 PM  Result Value Ref Range   Color, UA     Clarity, UA     Glucose, UA neg    Bilirubin, UA     Ketones, UA neg    Spec Grav, UA  1.010 - 1.025   Blood, UA neg    pH, UA  5.0 - 8.0   Protein, UA neg    Urobilinogen, UA  0.2 or 1.0 E.U./dL   Nitrite, UA neg    Leukocytes, UA Negative Negative   Appearance     Odor      Assessment & Plan:  1) High-risk pregnancy G2P1001 at [redacted]w[redacted]d with an Estimated Date of Delivery: 10/01/17   2) Short cervix, hx of preterm cervical shortening with term delivery. Taking prometrium 200mg  pv qhs  Meds: No orders of the defined types were placed in this encounter.  Labs/procedures today: U/S [place] TA/TV 18+6 wks,cx length 2.7-2.9 cm w/pressure,anterior pl gr 0.fhr 146 bpm,EFW 314 g 91%,svp of fluid 5 cm,normal ovaries bilat,anatomy complete,no obvious abnormalities   Treatment Plan:  CL q1wk, prometrium q hs  Reviewed: Preterm labor symptoms and general obstetric precautions including but not limited to vaginal bleeding, contractions, leaking of fluid and fetal movement were reviewed in detail with the patient.  All questions were answered.  Follow-up: Return in about 1 month (around 06/03/2017), or if symptoms worsen or fail to improve, for HROB.  Orders Placed This Encounter  Procedures  . POCT Urinalysis Dipstick      By signing my name below, I, Izna Ahmed, attest that this documentation has been prepared under the direction and in the presence  of Tilda BurrowFerguson, Jonetta Dagley V, MD. Electronically Signed: Redge GainerIzna Ahmed, Medical Scribe. 05/06/17. 3:03 PM.  I personally performed the services described in this documentation, which was SCRIBED in my presence. The recorded information has been reviewed and considered accurate. It has been edited as necessary during review. Tilda BurrowJohn V Sina Sumpter, MD

## 2017-05-06 NOTE — Progress Notes (Signed)
US TA/TV 18+6 wks,cx length 2.7-2.9 cm w/pressure,anterior pl gr 0.fhr 146 bpm,EFW 314 g 91%,svp of fluid 5 cm,normal ovaries bilat,anatomy complete,no obvious abnormalities

## 2017-05-20 ENCOUNTER — Encounter: Payer: Self-pay | Admitting: Obstetrics and Gynecology

## 2017-05-20 ENCOUNTER — Ambulatory Visit (INDEPENDENT_AMBULATORY_CARE_PROVIDER_SITE_OTHER): Payer: BLUE CROSS/BLUE SHIELD | Admitting: Advanced Practice Midwife

## 2017-05-20 ENCOUNTER — Ambulatory Visit (INDEPENDENT_AMBULATORY_CARE_PROVIDER_SITE_OTHER): Payer: BLUE CROSS/BLUE SHIELD

## 2017-05-20 VITALS — BP 102/60 | HR 96 | Wt 168.0 lb

## 2017-05-20 DIAGNOSIS — Z3A20 20 weeks gestation of pregnancy: Secondary | ICD-10-CM | POA: Diagnosis not present

## 2017-05-20 DIAGNOSIS — Z1389 Encounter for screening for other disorder: Secondary | ICD-10-CM

## 2017-05-20 DIAGNOSIS — Z331 Pregnant state, incidental: Secondary | ICD-10-CM

## 2017-05-20 DIAGNOSIS — O0992 Supervision of high risk pregnancy, unspecified, second trimester: Secondary | ICD-10-CM

## 2017-05-20 DIAGNOSIS — O09292 Supervision of pregnancy with other poor reproductive or obstetric history, second trimester: Secondary | ICD-10-CM | POA: Diagnosis not present

## 2017-05-20 DIAGNOSIS — O09299 Supervision of pregnancy with other poor reproductive or obstetric history, unspecified trimester: Secondary | ICD-10-CM

## 2017-05-20 DIAGNOSIS — Z3480 Encounter for supervision of other normal pregnancy, unspecified trimester: Secondary | ICD-10-CM

## 2017-05-20 DIAGNOSIS — O26872 Cervical shortening, second trimester: Secondary | ICD-10-CM

## 2017-05-20 LAB — POCT URINALYSIS DIPSTICK
GLUCOSE UA: NEGATIVE
Nitrite, UA: NEGATIVE
Protein, UA: NEGATIVE
RBC UA: NEGATIVE

## 2017-05-20 NOTE — Progress Notes (Signed)
HIGH-RISK PREGNANCY VISIT Patient name: Marie Rivera MRN [id number]  Date of birth: Jul 05, 1990 Chief Complaint:   Routine Prenatal Visit (US today)  History of Present Illness:   Marie Rivera is a 27 y.o. 672P1001 female at 2934w6d with an Estimated Date of Delivery: 10/01/17 being seen today for ongoing management of a high-risk pregnancy complicated by short cervix.  . Vag. Bleeding: None.  Movement: Present. denies leaking of fluid. Hasn't used prometrium in "a while"  Got sick, then never went back, esp after last cx us was notmal Review of Systems:   Pertinent items are noted in HPI Denies abnormal vaginal discharge w/ itching/odor/irritation, headaches, visual changes, shortness of breath, chest pain, abdominal pain, severe nausea/vomiting, or problems with urination or bowel movements unless otherwise stated above.    Pertinent History Reviewed:  Medical & Surgical Hx:   No past medical history on file. No past surgical history on file. Family History  Problem Relation Age of Onset  . Diabetes Mother   . Hypertension Mother   . Diabetes Maternal Grandmother   . Cancer Maternal Aunt   . Cancer Paternal Aunt     Current Outpatient Medications:  .  Prenatal MV-Min-FA-Omega-3 (PRENATAL GUMMIES/DHA & FA) 0.4-32.5 MG CHEW, Chew 2 tablets by mouth daily., Disp: , Rfl:  .  acetaminophen (TYLENOL) 325 MG tablet, Take 650 mg by mouth every 6 (six) hours as needed for mild pain. , Disp: , Rfl:  .  dextromethorphan-guaiFENesin (ROBITUSSIN-DM) 10-100 MG/5ML liquid, Take by mouth every 4 (four) hours as needed for cough., Disp: , Rfl:  .  guaiFENesin (MUCINEX) 600 MG 12 hr tablet, Take 600 mg by mouth daily as needed for cough or to loosen phlegm., Disp: , Rfl:  .  HYDROcodone-homatropine (HYCODAN) 5-1.5 MG/5ML syrup, Take 5 mLs by mouth every 6 (six) hours as needed for cough. (Patient not taking: Reported on 05/06/2017), Disp: 120 mL, Rfl: 0 .  metroNIDAZOLE (FLAGYL) 500 MG tablet, Take  1 tablet (500 mg total) by mouth 2 (two) times daily. (Patient not taking: Reported on 05/06/2017), Disp: 14 tablet, Rfl: 0 .  oseltamivir (TAMIFLU) 75 MG capsule, Take 1 capsule (75 mg total) by mouth daily. (Patient not taking: Reported on 05/06/2017), Disp: 5 capsule, Rfl: 0 .  progesterone (PROMETRIUM) 200 MG capsule, Place 1 capsule (200 mg total) vaginally at bedtime. (Patient not taking: Reported on 05/20/2017), Disp: 30 capsule, Rfl: 5 Social History: Reviewed -  reports that she has quit smoking. Her smoking use included cigarettes. She has a 0.04 pack-year smoking history. She has never used smokeless tobacco.   Physical Assessment:   Vitals:   05/20/17 1320  BP: 102/60  Pulse: 96  Weight: 168 lb (76.2 kg)  Body mass index is 30.73 kg/m.           Physical Examination:   General appearance: alert, well appearing, and in no distress  Mental status: alert, oriented to person, place, and time  Skin: warm & dry   Extremities: Edema: None    Cardiovascular: normal heart rate noted  Respiratory: normal respiratory effort, no distress  Abdomen: gravid, soft, non-tender  Pelvic: Cervical exam deferred         Fetal Status:     Movement: Present    Fetal Surveillance Testing today: US of FC  Results for orders placed or performed in visit on 05/20/17 (from the past 24 hour(s))  POCT Urinalysis Dipstick   Collection Time: 05/20/17  1:18 PM  Result Value Ref  Range   Color, UA     Clarity, UA     Glucose, UA neg    Bilirubin, UA     Ketones, UA small    Spec Grav, UA  1.010 - 1.025   Blood, UA negative    pH, UA  5.0 - 8.0   Protein, UA negative    Urobilinogen, UA  0.2 or 1.0 E.U./dL   Nitrite, UA negative    Leukocytes, UA Moderate (2+) (A) Negative   Appearance     Odor      Assessment & Plan:  1) High-risk pregnancy G2P1001 at [redacted]w[redacted]d with an Estimated Date of Delivery: 10/01/17   2) short cervix, stable  Now measuring 2.7-2.9, no funneling  3) ,   Labs/procedures  today: cx length:    Medications: pt does not plan on using prometrium, and now w/normal cx despite not using in a month, it's hard to disagree  Treatment Plan:  Continue 2 week cx length until 26-28 weeks  Follow-up: Return for q 2 weeks for cx length [place] and HROB.  Orders Placed This Encounter  Procedures  . POCT Urinalysis Dipstick   Jacklyn Shell CNM 05/20/2017 2:39 PM

## 2017-05-20 NOTE — Patient Instructions (Signed)
Marie Rivera, I greatly value your feedback.  If you receive a survey following your visit with us today, we appreciate you taking the time to fill it out.  Thanks, Cathie BeamsFran Cresenzo-Dishmon, CNM     Second Trimester of Pregnancy The second trimester is from week 14 through week 27 (months 4 through 6). The second trimester is often a time when you feel your best. Your body has adjusted to being pregnant, and you begin to feel better physically. Usually, morning sickness has lessened or quit completely, you may have more energy, and you may have an increase in appetite. The second trimester is also a time when the fetus is growing rapidly. At the end of the sixth month, the fetus is about 9 inches long and weighs about 1 pounds. You will likely begin to feel the baby move (quickening) between 16 and 20 weeks of pregnancy. Body changes during your second trimester Your body continues to go through many changes during your second trimester. The changes vary from woman to woman.  Your weight will continue to increase. You will notice your lower abdomen bulging out.  You may begin to get stretch marks on your hips, abdomen, and breasts.  You may develop headaches that can be relieved by medicines. The medicines should be approved by your health care provider.  You may urinate more often because the fetus is pressing on your bladder.  You may develop or continue to have heartburn as a result of your pregnancy.  You may develop constipation because certain hormones are causing the muscles that push waste through your intestines to slow down.  You may develop hemorrhoids or swollen, bulging veins (varicose veins).  You may have back pain. This is caused by: ? Weight gain. ? Pregnancy hormones that are relaxing the joints in your pelvis. ? A shift in weight and the muscles that support your balance.  Your breasts will continue to grow and they will continue to become tender.  Your gums may  bleed and may be sensitive to brushing and flossing.  Dark spots or blotches (chloasma, mask of pregnancy) may develop on your face. This will likely fade after the baby is born.  A dark line from your belly button to the pubic area (linea nigra) may appear. This will likely fade after the baby is born.  You may have changes in your hair. These can include thickening of your hair, rapid growth, and changes in texture. Some women also have hair loss during or after pregnancy, or hair that feels dry or thin. Your hair will most likely return to normal after your baby is born.  What to expect at prenatal visits During a routine prenatal visit:  You will be weighed to make sure you and the fetus are growing normally.  Your blood pressure will be taken.  Your abdomen will be measured to track your baby's growth.  The fetal heartbeat will be listened to.  Any test results from the previous visit will be discussed.  Your health care provider may ask you:  How you are feeling.  If you are feeling the baby move.  If you have had any abnormal symptoms, such as leaking fluid, bleeding, severe headaches, or abdominal cramping.  If you are using any tobacco products, including cigarettes, chewing tobacco, and electronic cigarettes.  If you have any questions.  Other tests that may be performed during your second trimester include:  Blood tests that check for: ? Low iron levels (anemia). ?  High blood sugar that affects pregnant women (gestational diabetes) between 20 and 28 weeks. ? Rh antibodies. This is to check for a protein on red blood cells (Rh factor).  Urine tests to check for infections, diabetes, or protein in the urine.  An ultrasound to confirm the proper growth and development of the baby.  An amniocentesis to check for possible genetic problems.  Fetal screens for spina bifida and Down syndrome.  HIV (human immunodeficiency virus) testing. Routine prenatal testing  includes screening for HIV, unless you choose not to have this test.  Follow these instructions at home: Medicines  Follow your health care provider's instructions regarding medicine use. Specific medicines may be either safe or unsafe to take during pregnancy.  Take a prenatal vitamin that contains at least 600 micrograms (mcg) of folic acid.  If you develop constipation, try taking a stool softener if your health care provider approves. Eating and drinking  Eat a balanced diet that includes fresh fruits and vegetables, whole grains, good sources of protein such as meat, eggs, or tofu, and low-fat dairy. Your health care provider will help you determine the amount of weight gain that is right for you.  Avoid raw meat and uncooked cheese. These carry germs that can cause birth defects in the baby.  If you have low calcium intake from food, talk to your health care provider about whether you should take a daily calcium supplement.  Limit foods that are high in fat and processed sugars, such as fried and sweet foods.  To prevent constipation: ? Drink enough fluid to keep your urine clear or pale yellow. ? Eat foods that are high in fiber, such as fresh fruits and vegetables, whole grains, and beans. Activity  Exercise only as directed by your health care provider. Most women can continue their usual exercise routine during pregnancy. Try to exercise for 30 minutes at least 5 days a week. Stop exercising if you experience uterine contractions.  Avoid heavy lifting, wear low heel shoes, and practice good posture.  A sexual relationship may be continued unless your health care provider directs you otherwise. Relieving pain and discomfort  Wear a good support bra to prevent discomfort from breast tenderness.  Take warm sitz baths to soothe any pain or discomfort caused by hemorrhoids. Use hemorrhoid cream if your health care provider approves.  Rest with your legs elevated if you have  leg cramps or low back pain.  If you develop varicose veins, wear support hose. Elevate your feet for 15 minutes, 3-4 times a day. Limit salt in your diet. Prenatal Care  Write down your questions. Take them to your prenatal visits.  Keep all your prenatal visits as told by your health care provider. This is important. Safety  Wear your seat belt at all times when driving.  Make a list of emergency phone numbers, including numbers for family, friends, the hospital, and police and fire departments. General instructions  Ask your health care provider for a referral to a local prenatal education class. Begin classes no later than the beginning of month 6 of your pregnancy.  Ask for help if you have counseling or nutritional needs during pregnancy. Your health care provider can offer advice or refer you to specialists for help with various needs.  Do not use hot tubs, steam rooms, or saunas.  Do not douche or use tampons or scented sanitary pads.  Do not cross your legs for long periods of time.  Avoid cat litter boxes and  soil used by cats. These carry germs that can cause birth defects in the baby and possibly loss of the fetus by miscarriage or stillbirth.  Avoid all smoking, herbs, alcohol, and unprescribed drugs. Chemicals in these products can affect the formation and growth of the baby.  Do not use any products that contain nicotine or tobacco, such as cigarettes and e-cigarettes. If you need help quitting, ask your health care provider.  Visit your dentist if you have not gone yet during your pregnancy. Use a soft toothbrush to brush your teeth and be gentle when you floss. Contact a health care provider if:  You have dizziness.  You have mild pelvic cramps, pelvic pressure, or nagging pain in the abdominal area.  You have persistent nausea, vomiting, or diarrhea.  You have a bad smelling vaginal discharge.  You have pain when you urinate. Get help right away if:  You  have a fever.  You are leaking fluid from your vagina.  You have spotting or bleeding from your vagina.  You have severe abdominal cramping or pain.  You have rapid weight gain or weight loss.  You have shortness of breath with chest pain.  You notice sudden or extreme swelling of your face, hands, ankles, feet, or legs.  You have not felt your baby move in over an hour.  You have severe headaches that do not go away when you take medicine.  You have vision changes. Summary  The second trimester is from week 14 through week 27 (months 4 through 6). It is also a time when the fetus is growing rapidly.  Your body goes through many changes during pregnancy. The changes vary from woman to woman.  Avoid all smoking, herbs, alcohol, and unprescribed drugs. These chemicals affect the formation and growth your baby.  Do not use any tobacco products, such as cigarettes, chewing tobacco, and e-cigarettes. If you need help quitting, ask your health care provider.  Contact your health care provider if you have any questions. Keep all prenatal visits as told by your health care provider. This is important. This information is not intended to replace advice given to you by your health care provider. Make sure you discuss any questions you have with your health care provider.      CHILDBIRTH CLASSES [phone] is the phone number for Pregnancy Classes or hospital tours at [place] will be referred to  [email or web address] for more information on childbirth classes  At this site you may register for classes. You may sign up for a waiting list if classes are full. Please SIGN UP FOR THIS!.   When the waiting list becomes long, sometimes new classes can be added.

## 2017-05-20 NOTE — Progress Notes (Addendum)
US 20+6 wks,breech,anterior pl gr 0,normal ovaries bilat,svp of fluid 4.9 cm,fhr 167 bpm,cx 2.7-2.9 w/pressure n/c

## 2017-05-22 ENCOUNTER — Telehealth: Payer: Self-pay | Admitting: *Deleted

## 2017-05-22 NOTE — Telephone Encounter (Signed)
Patient states she received a call from her STD regarding her returning to work with restrictions.  Asked what the restrictions and informed no lifting, pulling, pushing over 25 pounds.  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  Advised patient to wear a mask if possible.  Verbalized understanding.

## 2017-06-03 ENCOUNTER — Ambulatory Visit (INDEPENDENT_AMBULATORY_CARE_PROVIDER_SITE_OTHER): Payer: BLUE CROSS/BLUE SHIELD | Admitting: Obstetrics and Gynecology

## 2017-06-03 ENCOUNTER — Encounter: Payer: Self-pay | Admitting: Obstetrics and Gynecology

## 2017-06-03 ENCOUNTER — Ambulatory Visit (INDEPENDENT_AMBULATORY_CARE_PROVIDER_SITE_OTHER): Payer: BLUE CROSS/BLUE SHIELD

## 2017-06-03 VITALS — BP 100/50 | HR 93 | Wt 169.6 lb

## 2017-06-03 DIAGNOSIS — O09292 Supervision of pregnancy with other poor reproductive or obstetric history, second trimester: Secondary | ICD-10-CM

## 2017-06-03 DIAGNOSIS — O26872 Cervical shortening, second trimester: Secondary | ICD-10-CM

## 2017-06-03 DIAGNOSIS — Z3A22 22 weeks gestation of pregnancy: Secondary | ICD-10-CM | POA: Diagnosis not present

## 2017-06-03 DIAGNOSIS — Z331 Pregnant state, incidental: Secondary | ICD-10-CM

## 2017-06-03 DIAGNOSIS — O09299 Supervision of pregnancy with other poor reproductive or obstetric history, unspecified trimester: Secondary | ICD-10-CM

## 2017-06-03 DIAGNOSIS — Z3480 Encounter for supervision of other normal pregnancy, unspecified trimester: Secondary | ICD-10-CM

## 2017-06-03 DIAGNOSIS — Z1389 Encounter for screening for other disorder: Secondary | ICD-10-CM

## 2017-06-03 LAB — POCT URINALYSIS DIPSTICK
Blood, UA: NEGATIVE
GLUCOSE UA: NEGATIVE
Glucose, UA: NEGATIVE
KETONES UA: NEGATIVE
Leukocytes, UA: NEGATIVE
Nitrite, UA: NEGATIVE
Protein, UA: NEGATIVE

## 2017-06-03 NOTE — Progress Notes (Signed)
US TV 22+6 wks,cephalic,anterior pl gr 0,afi 15 cm,normal ovaries bilat,fhr 164 bpm,,cx length 2.6-2.7 cm w/pressure

## 2017-06-03 NOTE — Progress Notes (Signed)
HIGH-RISK PREGNANCY VISIT Patient name: Marie Rivera MRN [id number]  Date of birth: September 28, 1990 Chief Complaint:   Routine Prenatal Visit (US today)  History of Present Illness:   Marie Rivera is a 27 y.o. 422P1001 female at 3927w6d with an Estimated Date of Delivery: 10/01/17 being seen today for ongoing management of a high-risk pregnancy complicated by short cervix.   Today she reports no complaints.  . Vag. Bleeding: None.  Movement: Present. denies leaking of fluid.  Review of Systems:   Pertinent items are noted in HPI Denies abnormal vaginal discharge w/ itching/odor/irritation, headaches, visual changes, shortness of breath, chest pain, abdominal pain, severe nausea/vomiting, or problems with urination or bowel movements unless otherwise stated above. Pertinent History Reviewed:  Reviewed past medical,surgical, social, obstetrical and family history.  Reviewed problem list, medications and allergies. Physical Assessment:   Vitals:   06/03/17 1405  BP: (!) 100/50  Pulse: 93  Weight: 169 lb 9.6 oz (76.9 kg)  Body mass index is 31.02 kg/m.           Physical Examination:   General appearance: alert, well appearing, and in no distress and oriented to person, place, and time  Mental status: alert, oriented to person, place, and time, normal mood, behavior, speech, dress, motor activity, and thought processes  Skin: warm & dry   Extremities: Edema: None    Cardiovascular: normal heart rate noted  Respiratory: normal respiratory effort, no distress  Abdomen: gravid, soft, non-tender  Pelvic: Cervical exam deferred         Fetal Status: Fetal Heart Rate (bpm): +u/s 164 Fundal Height: 23 cm Movement: Present    Fetal Surveillance Testing today: doppler  Results for orders placed or performed in visit on 06/03/17 (from the past 24 hour(s))  POCT urinalysis dipstick   Collection Time: 06/03/17  2:11 PM  Result Value Ref Range   Color, UA     Clarity, UA     Glucose, UA  neg    Bilirubin, UA     Ketones, UA neg    Spec Grav, UA  1.010 - 1.025   Blood, UA neg    pH, UA  5.0 - 8.0   Protein, UA neg    Urobilinogen, UA  0.2 or 1.0 E.U./dL   Nitrite, UA neg    Leukocytes, UA Negative Negative   Appearance     Odor    Results for orders placed or performed in visit on 04/15/17 (from the past 24 hour(s))  POCT urinalysis dipstick   Collection Time: 06/03/17  2:11 PM  Result Value Ref Range   Color, UA     Clarity, UA     Glucose, UA neg    Bilirubin, UA     Ketones, UA     Spec Grav, UA  1.010 - 1.025   Blood, UA     pH, UA  5.0 - 8.0   Protein, UA trace    Urobilinogen, UA  0.2 or 1.0 E.U./dL   Nitrite, UA     Leukocytes, UA  Negative   Appearance     Odor      Assessment & Plan:  1) High-risk pregnancy G2P1001 at 6527w6d with an Estimated Date of Delivery: 10/01/17   2) short cervix, 2.6-2.7 cm, stable  Meds: No orders of the defined types were placed in this encounter.   Labs/procedures today: cervix length, U/S TV 22+6 wks,cephalic,anterior pl gr 0,afi 15 cm,normal ovaries bilat,fhr 164 bpm, cx length 2.6-2.7 cm  w/pressure   Treatment Plan:  Continue 2 week cx length until 26  Reviewed: Preterm labor symptoms and general obstetric precautions including but not limited to vaginal bleeding, contractions, leaking of fluid and fetal movement were reviewed in detail with the patient.  All questions were answered.  Follow-up: Return in about 1 month (around 07/01/2017), or if symptoms worsen or fail to improve, for LROB.  Orders Placed This Encounter  Procedures  . POCT urinalysis dipstick    By signing my name below, I, Izna Ahmed, attest that this documentation has been prepared under the direction and in the presence of Tilda Burrow, MD. Electronically Signed: Redge Gainer, Medical Scribe. 06/03/17. 2:37 PM.  I personally performed the services described in this documentation, which was SCRIBED in my presence. The recorded information  has been reviewed and considered accurate. It has been edited as necessary during review. Tilda Burrow, MD

## 2017-06-11 ENCOUNTER — Encounter: Payer: Self-pay | Admitting: *Deleted

## 2017-07-01 ENCOUNTER — Ambulatory Visit (INDEPENDENT_AMBULATORY_CARE_PROVIDER_SITE_OTHER): Payer: BLUE CROSS/BLUE SHIELD | Admitting: Advanced Practice Midwife

## 2017-07-01 VITALS — BP 118/60 | HR 90 | Wt 176.5 lb

## 2017-07-01 DIAGNOSIS — Z3480 Encounter for supervision of other normal pregnancy, unspecified trimester: Secondary | ICD-10-CM

## 2017-07-01 DIAGNOSIS — Z1389 Encounter for screening for other disorder: Secondary | ICD-10-CM

## 2017-07-01 DIAGNOSIS — Z331 Pregnant state, incidental: Secondary | ICD-10-CM

## 2017-07-01 DIAGNOSIS — O09299 Supervision of pregnancy with other poor reproductive or obstetric history, unspecified trimester: Secondary | ICD-10-CM

## 2017-07-01 DIAGNOSIS — Z3A26 26 weeks gestation of pregnancy: Secondary | ICD-10-CM

## 2017-07-01 DIAGNOSIS — Z3482 Encounter for supervision of other normal pregnancy, second trimester: Secondary | ICD-10-CM

## 2017-07-01 LAB — POCT URINALYSIS DIPSTICK
Blood, UA: NEGATIVE
Glucose, UA: NEGATIVE
KETONES UA: NEGATIVE
Leukocytes, UA: NEGATIVE
NITRITE UA: NEGATIVE
PROTEIN UA: NEGATIVE

## 2017-07-01 NOTE — Patient Instructions (Signed)
Marie Rivera, I greatly value your feedback.  If you receive a survey following your visit with [place] today, we appreciate you taking the time to fill it out.  Thanks, Cathie Beams, CNM   Call the office [phone]) or go to [facility] if:  You begin to have strong, frequent contractions  Your water breaks.  Sometimes it is a big gush of fluid, sometimes it is just a trickle that keeps getting your panties wet or running down your legs  You have vaginal bleeding.  It is normal to have a small amount of spotting if your cervix was checked.   You don't feel your baby moving like normal.  If you don't, get you something to eat and drink and lay down and focus on feeling your baby move.  You should feel at least 10 movements in 2 hours.  If you don't, you should call the office or go to [facility].    Tdap Vaccine  It is recommended that you get the Tdap vaccine during the third trimester of EACH pregnancy to help protect your baby from getting pertussis (whooping cough)  27-36 weeks is the BEST time to do this so that you can pass the protection on to your baby. During pregnancy is better than after pregnancy, but if you are unable to get it during pregnancy it will be offered at the hospital.   You can get this vaccine at the health department or your family doctor  Everyone who will be around your baby should also be up-to-date on their vaccines. Adults (who are not pregnant) only need 1 dose of Tdap during adulthood.   Third Trimester of Pregnancy The third trimester is from week 29 through week 42, months 7 through 9. The third trimester is a time when the fetus is growing rapidly. At the end of the ninth month, the fetus is about 20 inches in length and weighs 6-10 pounds.  BODY CHANGES Your body goes through many changes during pregnancy. The changes vary from woman to woman.   Your weight will continue to increase. You can expect to gain 25-35 pounds (11-16 kg)  by the end of the pregnancy.  You may begin to get stretch marks on your hips, abdomen, and breasts.  You may urinate more often because the fetus is moving lower into your pelvis and pressing on your bladder.  You may develop or continue to have heartburn as a result of your pregnancy.  You may develop constipation because certain hormones are causing the muscles that push waste through your intestines to slow down.  You may develop hemorrhoids or swollen, bulging veins (varicose veins).  You may have pelvic pain because of the weight gain and pregnancy hormones relaxing your joints between the bones in your pelvis. Backaches may result from overexertion of the muscles supporting your posture.  You may have changes in your hair. These can include thickening of your hair, rapid growth, and changes in texture. Some women also have hair loss during or after pregnancy, or hair that feels dry or thin. Your hair will most likely return to normal after your baby is born.  Your breasts will continue to grow and be tender. A yellow discharge may leak from your breasts called colostrum.  Your belly button may stick out.  You may feel short of breath because of your expanding uterus.  You may notice the fetus "dropping," or moving lower in your abdomen.  You may have a bloody mucus  discharge. This usually occurs a few days to a week before labor begins.  Your cervix becomes thin and soft (effaced) near your due date. WHAT TO EXPECT AT YOUR PRENATAL EXAMS  You will have prenatal exams every 2 weeks until week 36. Then, you will have weekly prenatal exams. During a routine prenatal visit:  You will be weighed to make sure you and the fetus are growing normally.  Your blood pressure is taken.  Your abdomen will be measured to track your baby's growth.  The fetal heartbeat will be listened to.  Any test results from the previous visit will be discussed.  You may have a cervical check near  your due date to see if you have effaced. At around 36 weeks, your caregiver will check your cervix. At the same time, your caregiver will also perform a test on the secretions of the vaginal tissue. This test is to determine if a type of bacteria, Group B streptococcus, is present. Your caregiver will explain this further. Your caregiver may ask you:  What your birth plan is.  How you are feeling.  If you are feeling the baby move.  If you have had any abnormal symptoms, such as leaking fluid, bleeding, severe headaches, or abdominal cramping.  If you have any questions. Other tests or screenings that may be performed during your third trimester include:  Blood tests that check for low iron levels (anemia).  Fetal testing to check the health, activity level, and growth of the fetus. Testing is done if you have certain medical conditions or if there are problems during the pregnancy. FALSE LABOR You may feel small, irregular contractions that eventually go away. These are called Braxton Hicks contractions, or false labor. Contractions may last for hours, days, or even weeks before true labor sets in. If contractions come at regular intervals, intensify, or become painful, it is best to be seen by your caregiver.  SIGNS OF LABOR   Menstrual-like cramps.  Contractions that are 5 minutes apart or less.  Contractions that start on the top of the uterus and spread down to the lower abdomen and back.  A sense of increased pelvic pressure or back pain.  A watery or bloody mucus discharge that comes from the vagina. If you have any of these signs before the 37th week of pregnancy, call your caregiver right away. You need to go to the hospital to get checked immediately. HOME CARE INSTRUCTIONS   Avoid all smoking, herbs, alcohol, and unprescribed drugs. These chemicals affect the formation and growth of the baby.  Follow your caregiver's instructions regarding medicine use. There are  medicines that are either safe or unsafe to take during pregnancy.  Exercise only as directed by your caregiver. Experiencing uterine cramps is a good sign to stop exercising.  Continue to eat regular, healthy meals.  Wear a good support bra for breast tenderness.  Do not use hot tubs, steam rooms, or saunas.  Wear your seat belt at all times when driving.  Avoid raw meat, uncooked cheese, cat litter boxes, and soil used by cats. These carry germs that can cause birth defects in the baby.  Take your prenatal vitamins.  Try taking a stool softener (if your caregiver approves) if you develop constipation. Eat more high-fiber foods, such as fresh vegetables or fruit and whole grains. Drink plenty of fluids to keep your urine clear or pale yellow.  Take warm sitz baths to soothe any pain or discomfort caused by hemorrhoids. Use  hemorrhoid cream if your caregiver approves.  If you develop varicose veins, wear support hose. Elevate your feet for 15 minutes, 3-4 times a day. Limit salt in your diet.  Avoid heavy lifting, wear low heal shoes, and practice good posture.  Rest a lot with your legs elevated if you have leg cramps or low back pain.  Visit your dentist if you have not gone during your pregnancy. Use a soft toothbrush to brush your teeth and be gentle when you floss.  A sexual relationship may be continued unless your caregiver directs you otherwise.  Do not travel far distances unless it is absolutely necessary and only with the approval of your caregiver.  Take prenatal classes to understand, practice, and ask questions about the labor and delivery.  Make a trial run to the hospital.  Pack your hospital bag.  Prepare the baby's nursery.  Continue to go to all your prenatal visits as directed by your caregiver. SEEK MEDICAL CARE IF:  You are unsure if you are in labor or if your water has broken.  You have dizziness.  You have mild pelvic cramps, pelvic pressure, or  nagging pain in your abdominal area.  You have persistent nausea, vomiting, or diarrhea.  You have a bad smelling vaginal discharge.  You have pain with urination. SEEK IMMEDIATE MEDICAL CARE IF:   You have a fever.  You are leaking fluid from your vagina.  You have spotting or bleeding from your vagina.  You have severe abdominal cramping or pain.  You have rapid weight loss or gain.  You have shortness of breath with chest pain.  You notice sudden or extreme swelling of your face, hands, ankles, feet, or legs.  You have not felt your baby move in over an hour.  You have severe headaches that do not go away with medicine.  You have vision changes. Document Released: 01/21/2001 Document Revised: 02/01/2013 Document Reviewed: 03/30/2012 [facility] Patient Information 2015 [place], [place]. This information is not intended to replace advice given to you by your health care provider. Make sure you discuss any questions you have with your health care provider.

## 2017-07-01 NOTE — Progress Notes (Signed)
  G2P1001 [redacted]w[redacted]d Estimated Date of Delivery: 10/01/17  Blood pressure 118/60, pulse 90, weight 176 lb 8 oz (80.1 kg), last menstrual period 12/25/2016.    Stopped using prometrium a few months ago when cx was found to be stable.  No problems  Out of work until 8/22 (they did not accomodate her lifting restrictions and works with a lot of gases so is fine not working)  BP weight and urine results all reviewed and noted.  Please refer to the obstetrical flow sheet for the fundal height and fetal heart rate documentation:  Patient reports good fetal movement, denies any bleeding and no rupture of membranes symptoms or regular contractions. Patient is without complaints. All questions were answered.   Physical Assessment:   Vitals:   07/01/17 1336  BP: 118/60  Pulse: 90  Weight: 176 lb 8 oz (80.1 kg)  Body mass index is 32.28 kg/m.        Physical Examination:   General appearance: Well appearing, and in no distress  Mental status: Alert, oriented to person, place, and time  Skin: Warm & dry  Cardiovascular: Normal heart rate noted  Respiratory: Normal respiratory effort, no distress  Abdomen: Soft, gravid, nontender  Pelvic: Cervical exam deferred         Extremities: Edema: None  Fetal Status:     Movement: Present    Results for orders placed or performed in visit on 07/01/17 (from the past 24 hour(s))  POCT Urinalysis Dipstick   Collection Time: 07/01/17  1:41 PM  Result Value Ref Range   Color, UA     Clarity, UA     Glucose, UA Negative Negative   Bilirubin, UA     Ketones, UA neg    Spec Grav, UA  1.010 - 1.025   Blood, UA neg    pH, UA  5.0 - 8.0   Protein, UA Negative Negative   Urobilinogen, UA  0.2 or 1.0 E.U./dL   Nitrite, UA neg    Leukocytes, UA Negative Negative   Appearance     Odor       Orders Placed This Encounter  Procedures  . POCT Urinalysis Dipstick    Plan:  Continued routine obstetrical care,   Return for asap for PN2 only; 3 weeks  for LROB.

## 2017-07-08 ENCOUNTER — Other Ambulatory Visit: Payer: BLUE CROSS/BLUE SHIELD

## 2017-07-08 DIAGNOSIS — Z3A27 27 weeks gestation of pregnancy: Secondary | ICD-10-CM | POA: Diagnosis not present

## 2017-07-08 DIAGNOSIS — Z131 Encounter for screening for diabetes mellitus: Secondary | ICD-10-CM | POA: Diagnosis not present

## 2017-07-08 DIAGNOSIS — Z3480 Encounter for supervision of other normal pregnancy, unspecified trimester: Secondary | ICD-10-CM | POA: Diagnosis not present

## 2017-07-09 LAB — CBC
HEMATOCRIT: 32.2 % — AB (ref 34.0–46.6)
HEMOGLOBIN: 10.4 g/dL — AB (ref 11.1–15.9)
MCH: 25.7 pg — ABNORMAL LOW (ref 26.6–33.0)
MCHC: 32.3 g/dL (ref 31.5–35.7)
MCV: 80 fL (ref 79–97)
Platelets: 290 10*3/uL (ref 150–450)
RBC: 4.04 x10E6/uL (ref 3.77–5.28)
RDW: 14.5 % (ref 12.3–15.4)
WBC: 11.3 10*3/uL — AB (ref 3.4–10.8)

## 2017-07-09 LAB — HIV ANTIBODY (ROUTINE TESTING W REFLEX): HIV Screen 4th Generation wRfx: NONREACTIVE

## 2017-07-09 LAB — AB SCR+ANTIBODY ID: ANTIBODY SCREEN: POSITIVE — AB

## 2017-07-09 LAB — GLUCOSE TOLERANCE, 2 HOURS W/ 1HR
GLUCOSE, 1 HOUR: 125 mg/dL (ref 65–179)
GLUCOSE, 2 HOUR: 76 mg/dL (ref 65–152)
Glucose, Fasting: 86 mg/dL (ref 65–91)

## 2017-07-09 LAB — RPR: RPR: NONREACTIVE

## 2017-07-09 LAB — ANTIBODY SCREEN

## 2017-07-22 ENCOUNTER — Encounter: Payer: Self-pay | Admitting: Advanced Practice Midwife

## 2017-07-22 ENCOUNTER — Ambulatory Visit (INDEPENDENT_AMBULATORY_CARE_PROVIDER_SITE_OTHER): Payer: BLUE CROSS/BLUE SHIELD | Admitting: Advanced Practice Midwife

## 2017-07-22 VITALS — BP 117/71 | HR 103 | Wt 186.0 lb

## 2017-07-22 DIAGNOSIS — Z1389 Encounter for screening for other disorder: Secondary | ICD-10-CM

## 2017-07-22 DIAGNOSIS — Z331 Pregnant state, incidental: Secondary | ICD-10-CM

## 2017-07-22 DIAGNOSIS — Z3480 Encounter for supervision of other normal pregnancy, unspecified trimester: Secondary | ICD-10-CM

## 2017-07-22 DIAGNOSIS — Z3483 Encounter for supervision of other normal pregnancy, third trimester: Secondary | ICD-10-CM

## 2017-07-22 DIAGNOSIS — Z23 Encounter for immunization: Secondary | ICD-10-CM | POA: Diagnosis not present

## 2017-07-22 DIAGNOSIS — Z3A29 29 weeks gestation of pregnancy: Secondary | ICD-10-CM

## 2017-07-22 LAB — POCT URINALYSIS DIPSTICK
GLUCOSE UA: NEGATIVE
Ketones, UA: NEGATIVE
Leukocytes, UA: NEGATIVE
Nitrite, UA: NEGATIVE
Protein, UA: NEGATIVE
RBC UA: NEGATIVE

## 2017-07-22 NOTE — Progress Notes (Signed)
  [id number] 2062w6d Estimated Date of Delivery: 10/01/17  Blood pressure 117/71, pulse (!) 103, weight 186 lb (84.4 kg), last menstrual period 12/25/2016.   BP weight and urine results all reviewed and noted.  Please refer to the obstetrical flow sheet for the fundal height and fetal heart rate documentation:  Patient reports good fetal movement, denies any bleeding and no rupture of membranes symptoms or regular contractions. Patient is without complaints. All questions were answered.   Physical Assessment:   Vitals:   07/22/17 1334  BP: 117/71  Pulse: (!) 103  Weight: 186 lb (84.4 kg)  Body mass index is 34.02 kg/m.        Physical Examination:   General appearance: Well appearing, and in no distress  Mental status: Alert, oriented to person, place, and time  Skin: Warm & dry  Cardiovascular: Normal heart rate noted  Respiratory: Normal respiratory effort, no distress  Abdomen: Soft, gravid, nontender  Pelvic: Cervical exam deferred         Extremities: Edema: None  Fetal Status:     Movement: Present    No results found for this or any previous visit (from the past 24 hour(s)).   No orders of the defined types were placed in this encounter.   Plan:  Continued routine obstetrical care,   Return in about 2 weeks (around 08/05/2017) for LROB.

## 2017-07-22 NOTE — Patient Instructions (Signed)
Onelia E Cuthrell, I greatly value your feedback.  If you receive a survey following your visit with us today, we appreciate you taking the time to fill it out.  Thanks, Cathie BeamsFran Cresenzo-Dishmon, CNM   Call the office [phone]) or go to [facility] if:  You begin to have strong, frequent contractions  Your water breaks.  Sometimes it is a big gush of fluid, sometimes it is just a trickle that keeps getting your panties wet or running down your legs  You have vaginal bleeding.  It is normal to have a small amount of spotting if your cervix was checked.   You don't feel your baby moving like normal.  If you don't, get you something to eat and drink and lay down and focus on feeling your baby move.  You should feel at least 10 movements in 2 hours.  If you don't, you should call the office or go to [facility].    Tdap Vaccine  It is recommended that you get the Tdap vaccine during the third trimester of EACH pregnancy to help protect your baby from getting pertussis (whooping cough)  27-36 weeks is the BEST time to do this so that you can pass the protection on to your baby. During pregnancy is better than after pregnancy, but if you are unable to get it during pregnancy it will be offered at the hospital.   You will be offered this vaccine in the office after 27 weeks. If you do not have health insurance, you can get this vaccine at the health department or your family doctor  Everyone who will be around your baby should also be up-to-date on their vaccines. Adults (who are not pregnant) only need 1 dose of Tdap during adulthood.   Third Trimester of Pregnancy The third trimester is from week 29 through week 42, months 7 through 9. The third trimester is a time when the fetus is growing rapidly. At the end of the ninth month, the fetus is about 20 inches in length and weighs 6-10 pounds.  BODY CHANGES Your body goes through many changes during pregnancy. The changes vary from woman  to woman.   Your weight will continue to increase. You can expect to gain 25-35 pounds (11-16 kg) by the end of the pregnancy.  You may begin to get stretch marks on your hips, abdomen, and breasts.  You may urinate more often because the fetus is moving lower into your pelvis and pressing on your bladder.  You may develop or continue to have heartburn as a result of your pregnancy.  You may develop constipation because certain hormones are causing the muscles that push waste through your intestines to slow down.  You may develop hemorrhoids or swollen, bulging veins (varicose veins).  You may have pelvic pain because of the weight gain and pregnancy hormones relaxing your joints between the bones in your pelvis. Backaches may result from overexertion of the muscles supporting your posture.  You may have changes in your hair. These can include thickening of your hair, rapid growth, and changes in texture. Some women also have hair loss during or after pregnancy, or hair that feels dry or thin. Your hair will most likely return to normal after your baby is born.  Your breasts will continue to grow and be tender. A yellow discharge may leak from your breasts called colostrum.  Your belly button may stick out.  You may feel short of breath because of your expanding uterus.  You may notice the fetus "dropping," or moving lower in your abdomen.  You may have a bloody mucus discharge. This usually occurs a few days to a week before labor begins.  Your cervix becomes thin and soft (effaced) near your due date. WHAT TO EXPECT AT YOUR PRENATAL EXAMS  You will have prenatal exams every 2 weeks until week 36. Then, you will have weekly prenatal exams. During a routine prenatal visit:  You will be weighed to make sure you and the fetus are growing normally.  Your blood pressure is taken.  Your abdomen will be measured to track your baby's growth.  The fetal heartbeat will be listened  to.  Any test results from the previous visit will be discussed.  You may have a cervical check near your due date to see if you have effaced. At around 36 weeks, your caregiver will check your cervix. At the same time, your caregiver will also perform a test on the secretions of the vaginal tissue. This test is to determine if a type of bacteria, Group B streptococcus, is present. Your caregiver will explain this further. Your caregiver may ask you:  What your birth plan is.  How you are feeling.  If you are feeling the baby move.  If you have had any abnormal symptoms, such as leaking fluid, bleeding, severe headaches, or abdominal cramping.  If you have any questions. Other tests or screenings that may be performed during your third trimester include:  Blood tests that check for low iron levels (anemia).  Fetal testing to check the health, activity level, and growth of the fetus. Testing is done if you have certain medical conditions or if there are problems during the pregnancy. FALSE LABOR You may feel small, irregular contractions that eventually go away. These are called Braxton Hicks contractions, or false labor. Contractions may last for hours, days, or even weeks before true labor sets in. If contractions come at regular intervals, intensify, or become painful, it is best to be seen by your caregiver.  SIGNS OF LABOR   Menstrual-like cramps.  Contractions that are 5 minutes apart or less.  Contractions that start on the top of the uterus and spread down to the lower abdomen and back.  A sense of increased pelvic pressure or back pain.  A watery or bloody mucus discharge that comes from the vagina. If you have any of these signs before the 37th week of pregnancy, call your caregiver right away. You need to go to the hospital to get checked immediately. HOME CARE INSTRUCTIONS   Avoid all smoking, herbs, alcohol, and unprescribed drugs. These chemicals affect the  formation and growth of the baby.  Follow your caregiver's instructions regarding medicine use. There are medicines that are either safe or unsafe to take during pregnancy.  Exercise only as directed by your caregiver. Experiencing uterine cramps is a good sign to stop exercising.  Continue to eat regular, healthy meals.  Wear a good support bra for breast tenderness.  Do not use hot tubs, steam rooms, or saunas.  Wear your seat belt at all times when driving.  Avoid raw meat, uncooked cheese, cat litter boxes, and soil used by cats. These carry germs that can cause birth defects in the baby.  Take your prenatal vitamins.  Try taking a stool softener (if your caregiver approves) if you develop constipation. Eat more high-fiber foods, such as fresh vegetables or fruit and whole grains. Drink plenty of fluids to keep your urine  clear or pale yellow.  Take warm sitz baths to soothe any pain or discomfort caused by hemorrhoids. Use hemorrhoid cream if your caregiver approves.  If you develop varicose veins, wear support hose. Elevate your feet for 15 minutes, 3-4 times a day. Limit salt in your diet.  Avoid heavy lifting, wear low heal shoes, and practice good posture.  Rest a lot with your legs elevated if you have leg cramps or low back pain.  Visit your dentist if you have not gone during your pregnancy. Use a soft toothbrush to brush your teeth and be gentle when you floss.  A sexual relationship may be continued unless your caregiver directs you otherwise.  Do not travel far distances unless it is absolutely necessary and only with the approval of your caregiver.  Take prenatal classes to understand, practice, and ask questions about the labor and delivery.  Make a trial run to the hospital.  Pack your hospital bag.  Prepare the baby's nursery.  Continue to go to all your prenatal visits as directed by your caregiver. SEEK MEDICAL CARE IF:  You are unsure if you are in  labor or if your water has broken.  You have dizziness.  You have mild pelvic cramps, pelvic pressure, or nagging pain in your abdominal area.  You have persistent nausea, vomiting, or diarrhea.  You have a bad smelling vaginal discharge.  You have pain with urination. SEEK IMMEDIATE MEDICAL CARE IF:   You have a fever.  You are leaking fluid from your vagina.  You have spotting or bleeding from your vagina.  You have severe abdominal cramping or pain.  You have rapid weight loss or gain.  You have shortness of breath with chest pain.  You notice sudden or extreme swelling of your face, hands, ankles, feet, or legs.  You have not felt your baby move in over an hour.  You have severe headaches that do not go away with medicine.  You have vision changes. Document Released: 01/21/2001 Document Revised: 02/01/2013 Document Reviewed: 03/30/2012 [facility] Patient Information 2015 [place], [place]. This information is not intended to replace advice given to you by your health care provider. Make sure you discuss any questions you have with your health care provider.

## 2017-08-05 ENCOUNTER — Encounter: Payer: Self-pay | Admitting: Women's Health

## 2017-08-05 ENCOUNTER — Ambulatory Visit (INDEPENDENT_AMBULATORY_CARE_PROVIDER_SITE_OTHER): Payer: BLUE CROSS/BLUE SHIELD | Admitting: Women's Health

## 2017-08-05 VITALS — BP 110/62 | HR 115 | Wt 180.0 lb

## 2017-08-05 DIAGNOSIS — Z3480 Encounter for supervision of other normal pregnancy, unspecified trimester: Secondary | ICD-10-CM

## 2017-08-05 DIAGNOSIS — Z3A31 31 weeks gestation of pregnancy: Secondary | ICD-10-CM

## 2017-08-05 DIAGNOSIS — Z3483 Encounter for supervision of other normal pregnancy, third trimester: Secondary | ICD-10-CM

## 2017-08-05 MED ORDER — FERROUS SULFATE 325 (65 FE) MG PO TABS: 325.0000 mg | ORAL_TABLET | Freq: Two times a day (BID) | ORAL | 3 refills | Status: DC

## 2017-08-05 NOTE — Progress Notes (Signed)
   LOW-RISK PREGNANCY VISIT Patient name: Marie Rivera MRN [id number]  Date of birth: [birth year] Chief Complaint:   Routine Prenatal Visit  History of Present Illness:   Marie Rivera is a 27 y.o. 772P1001 female at 6359w6d with an Estimated Date of Delivery: 10/01/17 being seen today for ongoing management of a low-risk pregnancy.  Today she reports no complaints. Contractions: Not present. Vag. Bleeding: None.  Movement: Present. denies leaking of fluid. Review of Systems:   Pertinent items are noted in HPI Denies abnormal vaginal discharge w/ itching/odor/irritation, headaches, visual changes, shortness of breath, chest pain, abdominal pain, severe nausea/vomiting, or problems with urination or bowel movements unless otherwise stated above. Pertinent History Reviewed:  Reviewed past medical,surgical, social, obstetrical and family history.  Reviewed problem list, medications and allergies. Physical Assessment:   Vitals:   08/05/17 1423  BP: 110/62  Pulse: (!) 115  Weight: 180 lb (81.6 kg)  Body mass index is 32.92 kg/m.        Physical Examination:   General appearance: Well appearing, and in no distress  Mental status: Alert, oriented to person, place, and time  Skin: Warm & dry  Cardiovascular: Normal heart rate noted  Respiratory: Normal respiratory effort, no distress  Abdomen: Soft, gravid, nontender  Pelvic: Cervical exam deferred         Extremities: Edema: None  Fetal Status: Fetal Heart Rate (bpm): 155 Fundal Height: 32 cm Movement: Present    No results found for this or any previous visit (from the past 24 hour(s)).  Assessment & Plan:  1) Low-risk pregnancy G2P1001 at 7159w6d with an Estimated Date of Delivery: 10/01/17   2) Mild anemia> on PN2, rx fe bid, increase fe-rich foods   Meds:  Meds ordered this encounter  Medications  . ferrous sulfate 325 (65 FE) MG tablet    Sig: Take 1 tablet (325 mg total) by mouth 2 (two) times daily with a meal.   Dispense:  60 tablet    Refill:  3    Order Specific Question:   Supervising Provider    Answer:   Lazaro ArmsEURE, LUTHER H [[id number]]   Labs/procedures today: none  Plan:  Continue routine obstetrical care   Reviewed: Preterm labor symptoms and general obstetric precautions including but not limited to vaginal bleeding, contractions, leaking of fluid and fetal movement were reviewed in detail with the patient.  All questions were answered  Follow-up: Return in about 2 weeks (around 08/19/2017) for LROB.  No orders of the defined types were placed in this encounter.  Cheral MarkerKimberly R Bart Ashford CNM, [facility] 08/05/2017 2:40 PM

## 2017-08-05 NOTE — Patient Instructions (Signed)
Marie Rivera, I greatly value your feedback.  If you receive a survey following your visit with us today, we appreciate you taking the time to fill it out.  Thanks, Joellyn HaffKim Booker, CNM, WHNP-BC   Call the office [phone]) or go to [facility] if:  You begin to have strong, frequent contractions  Your water breaks.  Sometimes it is a big gush of fluid, sometimes it is just a trickle that keeps getting your panties wet or running down your legs  You have vaginal bleeding.  It is normal to have a small amount of spotting if your cervix was checked.   You don't feel your baby moving like normal.  If you don't, get you something to eat and drink and lay down and focus on feeling your baby move.  You should feel at least 10 movements in 2 hours.  If you don't, you should call the office or go to [facility].    Tdap Vaccine  It is recommended that you get the Tdap vaccine during the third trimester of EACH pregnancy to help protect your baby from getting pertussis (whooping cough)  27-36 weeks is the BEST time to do this so that you can pass the protection on to your baby. During pregnancy is better than after pregnancy, but if you are unable to get it during pregnancy it will be offered at the hospital.   You can get this vaccine at the health department or your family doctor  Everyone who will be around your baby should also be up-to-date on their vaccines. Adults (who are not pregnant) only need 1 dose of Tdap during adulthood.   Third Trimester of Pregnancy The third trimester is from week 29 through week 42, months 7 through 9. The third trimester is a time when the fetus is growing rapidly. At the end of the ninth month, the fetus is about 20 inches in length and weighs 6-10 pounds.  BODY CHANGES Your body goes through many changes during pregnancy. The changes vary from woman to woman.   Your weight will continue to increase. You can expect to gain 25-35 pounds (11-16 kg) by  the end of the pregnancy.  You may begin to get stretch marks on your hips, abdomen, and breasts.  You may urinate more often because the fetus is moving lower into your pelvis and pressing on your bladder.  You may develop or continue to have heartburn as a result of your pregnancy.  You may develop constipation because certain hormones are causing the muscles that push waste through your intestines to slow down.  You may develop hemorrhoids or swollen, bulging veins (varicose veins).  You may have pelvic pain because of the weight gain and pregnancy hormones relaxing your joints between the bones in your pelvis. Backaches may result from overexertion of the muscles supporting your posture.  You may have changes in your hair. These can include thickening of your hair, rapid growth, and changes in texture. Some women also have hair loss during or after pregnancy, or hair that feels dry or thin. Your hair will most likely return to normal after your baby is born.  Your breasts will continue to grow and be tender. A yellow discharge may leak from your breasts called colostrum.  Your belly button may stick out.  You may feel short of breath because of your expanding uterus.  You may notice the fetus "dropping," or moving lower in your abdomen.  You may have a bloody  mucus discharge. This usually occurs a few days to a week before labor begins.  Your cervix becomes thin and soft (effaced) near your due date. WHAT TO EXPECT AT YOUR PRENATAL EXAMS  You will have prenatal exams every 2 weeks until week 36. Then, you will have weekly prenatal exams. During a routine prenatal visit:  You will be weighed to make sure you and the fetus are growing normally.  Your blood pressure is taken.  Your abdomen will be measured to track your baby's growth.  The fetal heartbeat will be listened to.  Any test results from the previous visit will be discussed.  You may have a cervical check near your  due date to see if you have effaced. At around 36 weeks, your caregiver will check your cervix. At the same time, your caregiver will also perform a test on the secretions of the vaginal tissue. This test is to determine if a type of bacteria, Group B streptococcus, is present. Your caregiver will explain this further. Your caregiver may ask you:  What your birth plan is.  How you are feeling.  If you are feeling the baby move.  If you have had any abnormal symptoms, such as leaking fluid, bleeding, severe headaches, or abdominal cramping.  If you have any questions. Other tests or screenings that may be performed during your third trimester include:  Blood tests that check for low iron levels (anemia).  Fetal testing to check the health, activity level, and growth of the fetus. Testing is done if you have certain medical conditions or if there are problems during the pregnancy. FALSE LABOR You may feel small, irregular contractions that eventually go away. These are called Braxton Hicks contractions, or false labor. Contractions may last for hours, days, or even weeks before true labor sets in. If contractions come at regular intervals, intensify, or become painful, it is best to be seen by your caregiver.  SIGNS OF LABOR   Menstrual-like cramps.  Contractions that are 5 minutes apart or less.  Contractions that start on the top of the uterus and spread down to the lower abdomen and back.  A sense of increased pelvic pressure or back pain.  A watery or bloody mucus discharge that comes from the vagina. If you have any of these signs before the 37th week of pregnancy, call your caregiver right away. You need to go to the hospital to get checked immediately. HOME CARE INSTRUCTIONS   Avoid all smoking, herbs, alcohol, and unprescribed drugs. These chemicals affect the formation and growth of the baby.  Follow your caregiver's instructions regarding medicine use. There are medicines  that are either safe or unsafe to take during pregnancy.  Exercise only as directed by your caregiver. Experiencing uterine cramps is a good sign to stop exercising.  Continue to eat regular, healthy meals.  Wear a good support bra for breast tenderness.  Do not use hot tubs, steam rooms, or saunas.  Wear your seat belt at all times when driving.  Avoid raw meat, uncooked cheese, cat litter boxes, and soil used by cats. These carry germs that can cause birth defects in the baby.  Take your prenatal vitamins.  Try taking a stool softener (if your caregiver approves) if you develop constipation. Eat more high-fiber foods, such as fresh vegetables or fruit and whole grains. Drink plenty of fluids to keep your urine clear or pale yellow.  Take warm sitz baths to soothe any pain or discomfort caused by hemorrhoids.  Use hemorrhoid cream if your caregiver approves.  If you develop varicose veins, wear support hose. Elevate your feet for 15 minutes, 3-4 times a day. Limit salt in your diet.  Avoid heavy lifting, wear low heal shoes, and practice good posture.  Rest a lot with your legs elevated if you have leg cramps or low back pain.  Visit your dentist if you have not gone during your pregnancy. Use a soft toothbrush to brush your teeth and be gentle when you floss.  A sexual relationship may be continued unless your caregiver directs you otherwise.  Do not travel far distances unless it is absolutely necessary and only with the approval of your caregiver.  Take prenatal classes to understand, practice, and ask questions about the labor and delivery.  Make a trial run to the hospital.  Pack your hospital bag.  Prepare the baby's nursery.  Continue to go to all your prenatal visits as directed by your caregiver. SEEK MEDICAL CARE IF:  You are unsure if you are in labor or if your water has broken.  You have dizziness.  You have mild pelvic cramps, pelvic pressure, or nagging  pain in your abdominal area.  You have persistent nausea, vomiting, or diarrhea.  You have a bad smelling vaginal discharge.  You have pain with urination. SEEK IMMEDIATE MEDICAL CARE IF:   You have a fever.  You are leaking fluid from your vagina.  You have spotting or bleeding from your vagina.  You have severe abdominal cramping or pain.  You have rapid weight loss or gain.  You have shortness of breath with chest pain.  You notice sudden or extreme swelling of your face, hands, ankles, feet, or legs.  You have not felt your baby move in over an hour.  You have severe headaches that do not go away with medicine.  You have vision changes. Document Released: 01/21/2001 Document Revised: 02/01/2013 Document Reviewed: 03/30/2012 [employer] Patient Information 2015 [employer]. This information is not intended to replace advice given to you by your health care provider. Make sure you discuss any questions you have with your health care provider.   

## 2017-08-19 ENCOUNTER — Encounter: Payer: BLUE CROSS/BLUE SHIELD | Admitting: Advanced Practice Midwife

## 2017-08-27 ENCOUNTER — Encounter: Payer: Self-pay | Admitting: Advanced Practice Midwife

## 2017-08-27 ENCOUNTER — Ambulatory Visit (INDEPENDENT_AMBULATORY_CARE_PROVIDER_SITE_OTHER): Payer: BLUE CROSS/BLUE SHIELD | Admitting: Advanced Practice Midwife

## 2017-08-27 VITALS — BP 110/68 | HR 102 | Wt 183.5 lb

## 2017-08-27 DIAGNOSIS — Z3483 Encounter for supervision of other normal pregnancy, third trimester: Secondary | ICD-10-CM

## 2017-08-27 DIAGNOSIS — Z3A35 35 weeks gestation of pregnancy: Secondary | ICD-10-CM

## 2017-08-27 DIAGNOSIS — Z331 Pregnant state, incidental: Secondary | ICD-10-CM

## 2017-08-27 DIAGNOSIS — Z1389 Encounter for screening for other disorder: Secondary | ICD-10-CM

## 2017-08-27 LAB — POCT URINALYSIS DIPSTICK OB
Blood, UA: NEGATIVE
Glucose, UA: NEGATIVE — AB
KETONES UA: NEGATIVE
NITRITE UA: NEGATIVE
PROTEIN: NEGATIVE

## 2017-08-27 NOTE — Progress Notes (Signed)
  G2P1001 6614w0d Estimated Date of Delivery: 10/01/17  Blood pressure 110/68, pulse (!) 102, weight 183 lb 8 oz (83.2 kg), last menstrual period 12/25/2016.   BP weight and urine results all reviewed and noted.  Please refer to the obstetrical flow sheet for the fundal height and fetal heart rate documentation:  Patient reports good fetal movement, denies any bleeding and no rupture of membranes symptoms or regular contractions. Patient has hard stools, try stool softeners All questions were answered.   Physical Assessment:   Vitals:   08/27/17 1433  BP: 110/68  Pulse: (!) 102  Weight: 183 lb 8 oz (83.2 kg)  Body mass index is 33.56 kg/m.        Physical Examination:   General appearance: Well appearing, and in no distress  Mental status: Alert, oriented to person, place, and time  Skin: Warm & dry  Cardiovascular: Normal heart rate noted  Respiratory: Normal respiratory effort, no distress  Abdomen: Soft, gravid, nontender  Pelvic: Cervical exam deferred         Extremities: Edema: Trace  Fetal Status:     Movement: Present    Results for orders placed or performed in visit on 08/27/17 (from the past 24 hour(s))  POC Urinalysis Dipstick OB   Collection Time: 08/27/17  3:02 PM  Result Value Ref Range   Color, UA     Clarity, UA     Glucose, UA Negative (A) (none)   Bilirubin, UA     Ketones, UA neg    Spec Grav, UA  1.010 - 1.025   Blood, UA neg    pH, UA  5.0 - 8.0   POC Protein UA Negative Negative, Trace   Urobilinogen, UA  0.2 or 1.0 E.U./dL   Nitrite, UA neg    Leukocytes, UA Trace (A) Negative   Appearance     Odor       Orders Placed This Encounter  Procedures  . POC Urinalysis Dipstick OB    Plan:  Continued routine obstetrical care, 36   Return in about 1 week (around 09/03/2017) for LROB.

## 2017-08-27 NOTE — Patient Instructions (Signed)

## 2017-09-03 ENCOUNTER — Ambulatory Visit (INDEPENDENT_AMBULATORY_CARE_PROVIDER_SITE_OTHER): Payer: BLUE CROSS/BLUE SHIELD | Admitting: Advanced Practice Midwife

## 2017-09-03 ENCOUNTER — Encounter: Payer: Self-pay | Admitting: Advanced Practice Midwife

## 2017-09-03 VITALS — BP 108/63 | HR 90 | Wt 186.0 lb

## 2017-09-03 DIAGNOSIS — Z3483 Encounter for supervision of other normal pregnancy, third trimester: Secondary | ICD-10-CM

## 2017-09-03 DIAGNOSIS — Z1389 Encounter for screening for other disorder: Secondary | ICD-10-CM

## 2017-09-03 DIAGNOSIS — Z331 Pregnant state, incidental: Secondary | ICD-10-CM

## 2017-09-03 LAB — POCT URINALYSIS DIPSTICK OB
Glucose, UA: NEGATIVE — AB
Ketones, UA: NEGATIVE
LEUKOCYTES UA: NEGATIVE
Nitrite, UA: NEGATIVE
POC,PROTEIN,UA: NEGATIVE
RBC UA: NEGATIVE

## 2017-09-03 NOTE — Patient Instructions (Signed)

## 2017-09-03 NOTE — Progress Notes (Signed)
LOW-RISK PREGNANCY VISIT Patient name: Marie Rivera MRN [id number]  Date of birth: 1990/03/11 Chief Complaint:   Routine Prenatal Visit  History of Present Illness:   Marie Rivera is a 27 y.o. G61P1001 female at [redacted]w[redacted]d with an Estimated Date of Delivery: 10/01/17 being seen today for ongoing management of a low-risk pregnancy.  Today she reports no complaints. Contractions: Irregular. Vag. Bleeding: None.  Movement: Present. denies leaking of fluid. Review of Systems:   Pertinent items are noted in HPI Denies abnormal vaginal discharge w/ itching/odor/irritation, headaches, visual changes, shortness of breath, chest pain, abdominal pain, severe nausea/vomiting, or problems with urination or bowel movements unless otherwise stated above.  Pertinent History Reviewed:  Medical & Surgical Hx:   History reviewed. No pertinent past medical history. History reviewed. No pertinent surgical history. Family History  Problem Relation Age of Onset  . Diabetes Mother   . Hypertension Mother   . Diabetes Maternal Grandmother   . Cancer Maternal Aunt   . Cancer Paternal Aunt     Current Outpatient Medications:  .  acetaminophen (TYLENOL) 325 MG tablet, Take 650 mg by mouth every 6 (six) hours as needed for mild pain. , Disp: , Rfl:  .  ferrous sulfate 325 (65 FE) MG tablet, Take 1 tablet (325 mg total) by mouth 2 (two) times daily with a meal., Disp: 60 tablet, Rfl: 3 .  Prenatal MV-Min-FA-Omega-3 (PRENATAL GUMMIES/DHA & FA) 0.4-32.5 MG CHEW, Chew 2 tablets by mouth daily., Disp: , Rfl:  Social History: Reviewed -  reports that she has quit smoking. Her smoking use included cigarettes. She has a 0.04 pack-year smoking history. She has never used smokeless tobacco.  Physical Assessment:   Vitals:   09/03/17 1343  BP: 108/63  Pulse: 90  Weight: 186 lb (84.4 kg)  Body mass index is 34.02 kg/m.        Physical Examination:   General appearance: Well appearing, and in no  distress  Mental status: Alert, oriented to person, place, and time  Skin: Warm & dry  Cardiovascular: Normal heart rate noted  Respiratory: Normal respiratory effort, no distress  Abdomen: Soft, gravid, nontender  Pelvic: Cervical exam performed  Dilation: 2 Effacement (%): 60 Station: -2  Extremities: Edema: None  Fetal Status: Fetal Heart Rate (bpm): 138 Fundal Height: 37 cm Movement: Present Presentation: Vertex  Results for orders placed or performed in visit on 09/03/17 (from the past 24 hour(s))  POC Urinalysis Dipstick OB   Collection Time: 09/03/17  1:46 PM  Result Value Ref Range   Color, UA     Clarity, UA     Glucose, UA Negative (A) (none)   Bilirubin, UA     Ketones, UA neg    Spec Grav, UA  1.010 - 1.025   Blood, UA neg    pH, UA  5.0 - 8.0   POC Protein UA Negative Negative, Trace   Urobilinogen, UA  0.2 or 1.0 E.U./dL   Nitrite, UA neg    Leukocytes, UA Negative Negative   Appearance     Odor      Assessment & Plan:  1) Low-risk pregnancy G2P1001 at [redacted]w[redacted]d with an Estimated Date of Delivery: 10/01/17   2) short cx, no longer on provera,    Labs/procedures/US today:   Plan:  Continue routine obstetrical care    Follow-up: Return in about 1 week (around 09/10/2017) for LROB.  Orders Placed This Encounter  Procedures  . POC Urinalysis Dipstick OB  Scarlette CalicoFrances Cresenzo-Dishmon CNM 09/03/2017 2:05 PM

## 2017-09-10 ENCOUNTER — Encounter: Payer: Self-pay | Admitting: Obstetrics & Gynecology

## 2017-09-10 ENCOUNTER — Ambulatory Visit (INDEPENDENT_AMBULATORY_CARE_PROVIDER_SITE_OTHER): Payer: BLUE CROSS/BLUE SHIELD | Admitting: Obstetrics & Gynecology

## 2017-09-10 VITALS — BP 109/71 | HR 86 | Wt 189.0 lb

## 2017-09-10 DIAGNOSIS — Z3483 Encounter for supervision of other normal pregnancy, third trimester: Secondary | ICD-10-CM

## 2017-09-10 DIAGNOSIS — Z331 Pregnant state, incidental: Secondary | ICD-10-CM

## 2017-09-10 DIAGNOSIS — Z1389 Encounter for screening for other disorder: Secondary | ICD-10-CM

## 2017-09-10 DIAGNOSIS — Z3A37 37 weeks gestation of pregnancy: Secondary | ICD-10-CM | POA: Diagnosis not present

## 2017-09-10 LAB — POCT URINALYSIS DIPSTICK OB
Blood, UA: NEGATIVE
GLUCOSE, UA: NEGATIVE — AB
KETONES UA: NEGATIVE
Leukocytes, UA: NEGATIVE
Nitrite, UA: NEGATIVE
POC,PROTEIN,UA: NEGATIVE

## 2017-09-10 LAB — OB RESULTS CONSOLE GBS: GBS: NEGATIVE

## 2017-09-10 NOTE — Progress Notes (Signed)
G2P1001 1068w0d Estimated Date of Delivery: 10/01/17  Blood pressure 109/71, pulse 86, weight 189 lb (85.7 kg), last menstrual period 12/25/2016.   BP weight and urine results all reviewed and noted.  Please refer to the obstetrical flow sheet for the fundal height and fetal heart rate documentation:  Patient reports good fetal movement, denies any bleeding and no rupture of membranes symptoms or regular contractions. Patient is without complaints. All questions were answered.  Orders Placed This Encounter  Procedures  . GC/Chlamydia Probe Amp([facility])  . Culture, beta strep (group b only)  . POC Urinalysis Dipstick OB    Plan:  Continued routine obstetrical care, cultures done cx 2/th/-2 soft/post/vtx  Return in about 1 week (around 09/17/2017) for LROB.

## 2017-09-13 LAB — GC/CHLAMYDIA PROBE AMP
Chlamydia trachomatis, NAA: NEGATIVE
Neisseria gonorrhoeae by PCR: NEGATIVE

## 2017-09-14 LAB — CULTURE, BETA STREP (GROUP B ONLY): Strep Gp B Culture: NEGATIVE

## 2017-09-17 ENCOUNTER — Encounter: Payer: Self-pay | Admitting: Advanced Practice Midwife

## 2017-09-17 ENCOUNTER — Ambulatory Visit (INDEPENDENT_AMBULATORY_CARE_PROVIDER_SITE_OTHER): Payer: BLUE CROSS/BLUE SHIELD | Admitting: Advanced Practice Midwife

## 2017-09-17 VITALS — BP 118/69 | HR 97 | Wt 189.3 lb

## 2017-09-17 DIAGNOSIS — Z3A38 38 weeks gestation of pregnancy: Secondary | ICD-10-CM

## 2017-09-17 DIAGNOSIS — Z3480 Encounter for supervision of other normal pregnancy, unspecified trimester: Secondary | ICD-10-CM

## 2017-09-17 DIAGNOSIS — Z331 Pregnant state, incidental: Secondary | ICD-10-CM

## 2017-09-17 DIAGNOSIS — Z3483 Encounter for supervision of other normal pregnancy, third trimester: Secondary | ICD-10-CM | POA: Diagnosis not present

## 2017-09-17 DIAGNOSIS — Z1389 Encounter for screening for other disorder: Secondary | ICD-10-CM

## 2017-09-17 LAB — POCT URINALYSIS DIPSTICK OB
GLUCOSE, UA: NEGATIVE — AB
KETONES UA: NEGATIVE
Leukocytes, UA: NEGATIVE
Nitrite, UA: NEGATIVE
POC,PROTEIN,UA: NEGATIVE
RBC UA: NEGATIVE

## 2017-09-17 NOTE — Progress Notes (Signed)
  G2P1001 3217w0d Estimated Date of Delivery: 10/01/17  Blood pressure 118/69, pulse 97, weight 189 lb 4.8 oz (85.9 kg), last menstrual period 12/25/2016.   BP weight and urine results all reviewed and noted.  Please refer to the obstetrical flow sheet for the fundal height and fetal heart rate documentation:  Patient reports good fetal movement, denies any bleeding and no rupture of membranes symptoms or regular contractions. Had some last night Patient is without complaints. All questions were answered.   Physical Assessment:   Vitals:   09/17/17 1449  BP: 118/69  Pulse: 97  Weight: 189 lb 4.8 oz (85.9 kg)  Body mass index is 34.62 kg/m.        Physical Examination:   General appearance: Well appearing, and in no distress  Mental status: Alert, oriented to person, place, and time  Skin: Warm & dry  Cardiovascular: Normal heart rate noted  Respiratory: Normal respiratory effort, no distress  Abdomen: Soft, gravid, nontender  Pelvic: Cervical exam performed  Dilation: 3 Effacement (%): 50 Station: -2  Extremities: Edema: None  Fetal Status: Fetal Heart Rate (bpm): 142 Fundal Height: 38 cm Movement: Present Presentation: Vertex  Results for orders placed or performed in visit on 09/17/17 (from the past 24 hour(s))  POC Urinalysis Dipstick OB   Collection Time: 09/17/17  2:55 PM  Result Value Ref Range   Color, UA     Clarity, UA     Glucose, UA Negative (A) (none)   Bilirubin, UA     Ketones, UA neg    Spec Grav, UA     Blood, UA neg    pH, UA     POC Protein UA Negative Negative, Trace   Urobilinogen, UA     Nitrite, UA neg    Leukocytes, UA Negative Negative   Appearance     Odor       Orders Placed This Encounter  Procedures  . POC Urinalysis Dipstick OB    Plan:  Continued routine obstetrical care,   Return in about 1 week (around 09/24/2017) for LROB.

## 2017-09-17 NOTE — Patient Instructions (Signed)

## 2017-09-22 ENCOUNTER — Telehealth: Payer: Self-pay | Admitting: *Deleted

## 2017-09-22 ENCOUNTER — Inpatient Hospital Stay (HOSPITAL_COMMUNITY): Payer: BLUE CROSS/BLUE SHIELD | Admitting: Anesthesiology

## 2017-09-22 ENCOUNTER — Inpatient Hospital Stay (HOSPITAL_COMMUNITY)
Admission: AD | Admit: 2017-09-22 | Discharge: 2017-09-23 | DRG: 806 | Disposition: A | Discharge status: Home / Self Care | Payer: BLUE CROSS/BLUE SHIELD | Attending: Family Medicine | Admitting: Family Medicine

## 2017-09-22 ENCOUNTER — Encounter (HOSPITAL_COMMUNITY): Payer: Self-pay

## 2017-09-22 DIAGNOSIS — O26873 Cervical shortening, third trimester: Secondary | ICD-10-CM | POA: Diagnosis present

## 2017-09-22 DIAGNOSIS — Z3A38 38 weeks gestation of pregnancy: Secondary | ICD-10-CM | POA: Diagnosis not present

## 2017-09-22 DIAGNOSIS — Z3483 Encounter for supervision of other normal pregnancy, third trimester: Secondary | ICD-10-CM | POA: Diagnosis not present

## 2017-09-22 DIAGNOSIS — Z3480 Encounter for supervision of other normal pregnancy, unspecified trimester: Secondary | ICD-10-CM

## 2017-09-22 DIAGNOSIS — O360130 Maternal care for anti-D [Rh] antibodies, third trimester, not applicable or unspecified: Secondary | ICD-10-CM | POA: Diagnosis not present

## 2017-09-22 DIAGNOSIS — Z87891 Personal history of nicotine dependence: Secondary | ICD-10-CM

## 2017-09-22 DIAGNOSIS — O479 False labor, unspecified: Secondary | ICD-10-CM | POA: Diagnosis present

## 2017-09-22 LAB — CBC
HEMATOCRIT: 35.2 % — AB (ref 36.0–46.0)
Hemoglobin: 11.2 g/dL — ABNORMAL LOW (ref 12.0–15.0)
MCH: 24.3 pg — ABNORMAL LOW (ref 26.0–34.0)
MCHC: 31.8 g/dL (ref 30.0–36.0)
MCV: 76.4 fL — ABNORMAL LOW (ref 78.0–100.0)
Platelets: 268 10*3/uL (ref 150–400)
RBC: 4.61 MIL/uL (ref 3.87–5.11)
RDW: 15.5 % (ref 11.5–15.5)
WBC: 12.7 10*3/uL — ABNORMAL HIGH (ref 4.0–10.5)

## 2017-09-22 LAB — TYPE AND SCREEN
ABO/RH(D): A POS
ANTIBODY SCREEN: NEGATIVE

## 2017-09-22 LAB — ABO/RH: ABO/RH(D): A POS

## 2017-09-22 LAB — RPR: RPR Ser Ql: NONREACTIVE

## 2017-09-22 MED ORDER — WITCH HAZEL-GLYCERIN EX PADS: 1.0000 "application " | MEDICATED_PAD | CUTANEOUS | Status: DC | PRN

## 2017-09-22 MED ORDER — SOD CITRATE-CITRIC ACID 500-334 MG/5ML PO SOLN: 30.0000 mL | ORAL | Status: DC | PRN

## 2017-09-22 MED ORDER — BENZOCAINE-MENTHOL 20-0.5 % EX AERO: 1.0000 "application " | INHALATION_SPRAY | CUTANEOUS | Status: DC | PRN

## 2017-09-22 MED ORDER — ONDANSETRON HCL 4 MG/2ML IJ SOLN
4.0000 mg | Freq: Four times a day (QID) | INTRAMUSCULAR | Status: DC | PRN
  Administered 2017-09-22: 4 mg via INTRAVENOUS
  Filled 2017-09-22: qty 2

## 2017-09-22 MED ORDER — FENTANYL 2.5 MCG/ML BUPIVACAINE 1/10 % EPIDURAL INFUSION (WH - ANES)
14.0000 mL/h | INTRAMUSCULAR | Status: DC | PRN
  Administered 2017-09-22: 14 mL/h via EPIDURAL
  Filled 2017-09-22: qty 100

## 2017-09-22 MED ORDER — TERBUTALINE SULFATE 1 MG/ML IJ SOLN
0.2500 mg | Freq: Once | INTRAMUSCULAR | Status: DC | PRN
  Filled 2017-09-22: qty 1

## 2017-09-22 MED ORDER — COCONUT OIL OIL: 1.0000 "application " | TOPICAL_OIL | Status: DC | PRN

## 2017-09-22 MED ORDER — PHENYLEPHRINE 40 MCG/ML (10ML) SYRINGE FOR IV PUSH (FOR BLOOD PRESSURE SUPPORT)
80.0000 ug | PREFILLED_SYRINGE | INTRAVENOUS | Status: DC | PRN
  Filled 2017-09-22: qty 10
  Filled 2017-09-22: qty 5

## 2017-09-22 MED ORDER — OXYTOCIN 40 UNITS IN LACTATED RINGERS INFUSION - SIMPLE MED: 1.0000 m[IU]/min | INTRAVENOUS | Status: DC

## 2017-09-22 MED ORDER — OXYTOCIN BOLUS FROM INFUSION
500.0000 mL | Freq: Once | INTRAVENOUS | Status: AC
  Administered 2017-09-22: 500 mL via INTRAVENOUS

## 2017-09-22 MED ORDER — LACTATED RINGERS IV SOLN: 500.0000 mL | Freq: Once | INTRAVENOUS | Status: DC

## 2017-09-22 MED ORDER — ACETAMINOPHEN 325 MG PO TABS
650.0000 mg | ORAL_TABLET | ORAL | Status: DC | PRN
  Administered 2017-09-22: 650 mg via ORAL
  Filled 2017-09-22: qty 2

## 2017-09-22 MED ORDER — ACETAMINOPHEN 325 MG PO TABS: 650.0000 mg | ORAL_TABLET | ORAL | Status: DC | PRN

## 2017-09-22 MED ORDER — OXYCODONE-ACETAMINOPHEN 5-325 MG PO TABS: 2.0000 | ORAL_TABLET | ORAL | Status: DC | PRN

## 2017-09-22 MED ORDER — VITAMIN K1 1 MG/0.5ML IJ SOLN
INTRAMUSCULAR | Status: AC
  Filled 2017-09-22: qty 0.5

## 2017-09-22 MED ORDER — ZOLPIDEM TARTRATE 5 MG PO TABS: 5.0000 mg | ORAL_TABLET | Freq: Every evening | ORAL | Status: DC | PRN

## 2017-09-22 MED ORDER — TETANUS-DIPHTH-ACELL PERTUSSIS 5-2.5-18.5 LF-MCG/0.5 IM SUSP: 0.5000 mL | Freq: Once | INTRAMUSCULAR | Status: DC

## 2017-09-22 MED ORDER — DIPHENHYDRAMINE HCL 50 MG/ML IJ SOLN: 12.5000 mg | INTRAMUSCULAR | Status: DC | PRN

## 2017-09-22 MED ORDER — IBUPROFEN 600 MG PO TABS
600.0000 mg | ORAL_TABLET | Freq: Four times a day (QID) | ORAL | Status: DC
  Administered 2017-09-22 – 2017-09-23 (×6): 600 mg via ORAL
  Filled 2017-09-22 (×6): qty 1

## 2017-09-22 MED ORDER — DIBUCAINE 1 % RE OINT: 1.0000 "application " | TOPICAL_OINTMENT | RECTAL | Status: DC | PRN

## 2017-09-22 MED ORDER — LACTATED RINGERS IV SOLN: 500.0000 mL | INTRAVENOUS | Status: DC | PRN

## 2017-09-22 MED ORDER — OXYTOCIN 40 UNITS IN LACTATED RINGERS INFUSION - SIMPLE MED
2.5000 [IU]/h | INTRAVENOUS | Status: DC
  Filled 2017-09-22: qty 1000

## 2017-09-22 MED ORDER — EPHEDRINE 5 MG/ML INJ
10.0000 mg | INTRAVENOUS | Status: DC | PRN
  Filled 2017-09-22: qty 2

## 2017-09-22 MED ORDER — ONDANSETRON HCL 4 MG/2ML IJ SOLN: 4.0000 mg | INTRAMUSCULAR | Status: DC | PRN

## 2017-09-22 MED ORDER — DIPHENHYDRAMINE HCL 25 MG PO CAPS: 25.0000 mg | ORAL_CAPSULE | Freq: Four times a day (QID) | ORAL | Status: DC | PRN

## 2017-09-22 MED ORDER — LIDOCAINE HCL (PF) 1 % IJ SOLN
INTRAMUSCULAR | Status: DC | PRN
  Administered 2017-09-22: 13 mL via EPIDURAL

## 2017-09-22 MED ORDER — SIMETHICONE 80 MG PO CHEW: 80.0000 mg | CHEWABLE_TABLET | ORAL | Status: DC | PRN

## 2017-09-22 MED ORDER — OXYCODONE-ACETAMINOPHEN 5-325 MG PO TABS: 1.0000 | ORAL_TABLET | ORAL | Status: DC | PRN

## 2017-09-22 MED ORDER — OXYCODONE HCL 5 MG PO TABS: 5.0000 mg | ORAL_TABLET | ORAL | Status: DC | PRN

## 2017-09-22 MED ORDER — ONDANSETRON HCL 4 MG PO TABS: 4.0000 mg | ORAL_TABLET | ORAL | Status: DC | PRN

## 2017-09-22 MED ORDER — PRENATAL MULTIVITAMIN CH
1.0000 | ORAL_TABLET | Freq: Every day | ORAL | Status: DC
  Administered 2017-09-23: 1 via ORAL
  Filled 2017-09-22: qty 1

## 2017-09-22 MED ORDER — PHENYLEPHRINE 40 MCG/ML (10ML) SYRINGE FOR IV PUSH (FOR BLOOD PRESSURE SUPPORT)
80.0000 ug | PREFILLED_SYRINGE | INTRAVENOUS | Status: DC | PRN
  Administered 2017-09-22: 80 ug via INTRAVENOUS
  Filled 2017-09-22: qty 5

## 2017-09-22 MED ORDER — LACTATED RINGERS IV SOLN
INTRAVENOUS | Status: DC
  Administered 2017-09-22: 09:00:00 via INTRAVENOUS

## 2017-09-22 MED ORDER — SENNOSIDES-DOCUSATE SODIUM 8.6-50 MG PO TABS
2.0000 | ORAL_TABLET | ORAL | Status: DC
  Administered 2017-09-23: 2 via ORAL
  Filled 2017-09-22: qty 2

## 2017-09-22 MED ORDER — LIDOCAINE HCL (PF) 1 % IJ SOLN
30.0000 mL | INTRAMUSCULAR | Status: DC | PRN
  Filled 2017-09-22: qty 30

## 2017-09-22 NOTE — Telephone Encounter (Signed)
Lmom for pt to call us back to schedule pp appointment.  09-22-17  AS

## 2017-09-22 NOTE — H&P (Addendum)
Obstetrics Admission History & Physical   Marie Rivera is a 27 y.o. female G2P1001 at 1634w5d presenting for contractions. States they started at 0400. Denies vaginal bleeding, vaginal discharge, dysuria, LOF. Denies normal fetal movement but states she was more focused on the pain.  Pregnancy has been complicated by short cervix, Lewis isoimmunization (anti-Lewis A)  [facility]  OB History    Gravida  2   Para  1   Term  1   Preterm      AB      Living  1     SAB      TAB      Ectopic      Multiple      Live Births  1          History reviewed. No pertinent past medical history. Past Surgical History:  Procedure Laterality Date  . NO PAST SURGERIES     Family History: family history includes Cancer in her maternal aunt and paternal aunt; Diabetes in her maternal grandmother and mother; Hypertension in her mother. Social History:  reports that she has quit smoking. Her smoking use included cigarettes. She has a 0.04 pack-year smoking history. She has never used smokeless tobacco. She reports that she does not drink alcohol or use drugs.     Maternal Diabetes: No Genetic Screening: Normal Maternal Ultrasounds/Referrals: Normal Fetal Ultrasounds or other Referrals:  None Maternal Substance Abuse:  No Significant Maternal Medications:  Meds include: Other: Prometrium Significant Maternal Lab Results:  Anti-lewis antibody Other Comments:  None  Review of Systems  Gastrointestinal: Positive for abdominal pain.   as above  Dilation: 7.5 Effacement (%): 80 Station: 0 Exam by:: Lauren Cox RN   Blood pressure 91/66, pulse 98, temperature 97.7 F (36.5 C), temperature source Oral, resp. rate (!) 22, height 5\' 2"  (1.575 m), weight 85.3 kg, last menstrual period 12/25/2016, SpO2 99 %.  Physical Exam  Constitutional: She is oriented to person, place, and time. She appears well-developed and well-nourished. No distress.   HENT:  Head: Normocephalic and atraumatic.  Neck: Normal range of motion.  Respiratory: Effort normal. No respiratory distress.  Musculoskeletal: Normal range of motion.  Neurological: She is alert and oriented to person, place, and time.  Psychiatric: She has a normal mood and affect.  EFM: 135 bpm, min variability, no accels, no decels Toco: 1-4   Prenatal labs: ABO, Rh: A/Positive/-- (01/23 1631) Antibody: Positive, See Final Results (05/29 0908) Rubella: 1.53 (01/23 1631) RPR: Non Reactive (05/29 0908)  HBsAg: Negative (01/23 1631)  HIV: Non Reactive (05/29 0908)  GBS: Negative (08/01 0000)   Assessment/Plan: Admit for SOL.  Routine intrapartum care. Getting epidural  ------ Candis SchatzPatricia Dell, DO Family Medicine, PGY-3    Midwife attestation: I have seen and examined this patient; I agree with above documentation in the resident's note.   PE: Gen: calm comfortable, NAD Resp: normal effort and rate Abd: gravid  ROS, labs, PMH reviewed  Assessment/Plan: Marie Rivera is a 27 y.o. G2P1001 here for labor Admit to LD Labor: active FWB: Cat II ID: GBS neg Expectant management, anticipate SVD  Donette LarryMelanie Dickey Caamano, CNM  09/22/2017, 7:11 AM

## 2017-09-22 NOTE — Anesthesia Preprocedure Evaluation (Signed)
Anesthesia Evaluation  Patient identified by MRN, date of birth, ID band Patient awake    Reviewed: Allergy & Precautions, H&P , NPO status , Patient's Chart, lab work & pertinent test results  History of Anesthesia Complications Negative for: history of anesthetic complications  Airway Mallampati: II  TM Distance: >3 FB Neck ROM: full    Dental no notable dental hx. (+) Teeth Intact   Pulmonary neg pulmonary ROS, former smoker,    Pulmonary exam normal breath sounds clear to auscultation       Cardiovascular negative cardio ROS   Rhythm:regular Rate:Normal     Neuro/Psych negative neurological ROS  negative psych ROS   GI/Hepatic negative GI ROS, Neg liver ROS,   Endo/Other  negative endocrine ROS  Renal/GU negative Renal ROS  negative genitourinary   Musculoskeletal   Abdominal Normal abdominal exam  (+)   Peds  Hematology negative hematology ROS (+)   Anesthesia Other Findings   Reproductive/Obstetrics (+) Pregnancy                             Anesthesia Physical  Anesthesia Plan  ASA: II  Anesthesia Plan: Epidural   Post-op Pain Management:    Induction:   PONV Risk Score and Plan:   Airway Management Planned:   Additional Equipment:   Intra-op Plan:   Post-operative Plan:   Informed Consent: I have reviewed the patients History and Physical, chart, labs and discussed the procedure including the risks, benefits and alternatives for the proposed anesthesia with the patient or authorized representative who has indicated his/her understanding and acceptance.     Plan Discussed with:   Anesthesia Plan Comments:         Anesthesia Quick Evaluation

## 2017-09-22 NOTE — Anesthesia Procedure Notes (Signed)
Epidural Patient location during procedure: OB Start time: 09/22/2017 6:32 AM End time: 09/22/2017 6:45 AM  Staffing Anesthesiologist: Lowella CurbMiller, Shirlene Andaya Ray, MD Performed: anesthesiologist   Preanesthetic Checklist Completed: patient identified, site marked, surgical consent, pre-op evaluation, timeout performed, IV checked, risks and benefits discussed and monitors and equipment checked  Epidural Patient position: sitting Prep: ChloraPrep Patient monitoring: heart rate, cardiac monitor, continuous pulse ox and blood pressure Approach: midline Location: L2-L3 Injection technique: LOR saline  Needle:  Needle type: Tuohy  Needle gauge: 17 G Needle length: 9 cm Needle insertion depth: 5 cm Catheter type: closed end flexible Catheter size: 20 Guage Catheter at skin depth: 9 cm Test dose: negative  Assessment Events: blood not aspirated, injection not painful, no injection resistance, negative IV test and no paresthesia  Additional Notes Reason for block:procedure for pain

## 2017-09-22 NOTE — Anesthesia Postprocedure Evaluation (Signed)
Anesthesia Post Note  Patient: Marie Rivera  Procedure(s) Performed: AN AD HOC LABOR EPIDURAL     Patient location during evaluation: Mother Baby Anesthesia Type: Epidural Level of consciousness: awake and alert and oriented Pain management: satisfactory to patient Vital Signs Assessment: post-procedure vital signs reviewed and stable Respiratory status: spontaneous breathing and nonlabored ventilation Cardiovascular status: stable Postop Assessment: no headache, no backache, no signs of nausea or vomiting, adequate PO intake and patient able to bend at knees (patient up walking) Anesthetic complications: no    Last Vitals:  Vitals:   09/22/17 1210 09/22/17 1310  BP: 112/76 (!) 89/54  Pulse: 80 85  Resp: 18 16  Temp: 36.7 C 37.1 C  SpO2: 99% 98%    Last Pain:  Vitals:   09/22/17 1210  TempSrc: Oral  PainSc:    Pain Goal:                 Madison HickmanGREGORY,Suly Vukelich

## 2017-09-23 ENCOUNTER — Ambulatory Visit: Payer: Self-pay

## 2017-09-23 MED ORDER — IBUPROFEN 600 MG PO TABS: 600.0000 mg | ORAL_TABLET | Freq: Four times a day (QID) | ORAL | 0 refills | Status: DC

## 2017-09-23 NOTE — Lactation Note (Signed)
This note was copied from a baby's chart. Lactation Consultation Note  Patient Name: Marie Theodis BlazeShacree Handel GEXBM'WToday's Date: 09/23/2017 Reason for consult: Initial assessment;Early term 4437-38.6wks  4033 hours old early term female who is being mostly BF by her mother, she's a P2 and experienced BF. She was able to BF her oldest one for 3 months; and she already knows how to hand express; [facility] [place]. Mom has large rounded nipples, and she's been able to get about 6 cc of colostrum between her hand pump and her DEBP.   LC assisted mom with syringe feeding, showed parents how to syringe feed baby, she took about 2 cc of colostrum, baby tends to thrust her tongue, mom couldn't assist with suck training due to having very long nails, at least an inch or longer. Mom voiced she tried to feed baby some formula earlier, requested Similac 19 calorie formula but baby only took drops, less than 1 cc.   Mom requested to go home today, reviewed discharge instructions, red flags on when to call your baby's pediatrician, treatment for sore nipples and engorgement prevention/treatment. Discussed cluster feeding, benefits of STS and lactogenesis II.  Encouraged mom to feed baby 8-12 times/24 hours or sooner if feeding cues are present. If baby is not cueing in a 3 hour period, mom will place her STS to the breast to give her an opportunity to feed. BF brochure, BF resources and feeding diary were reviewed, both parents are aware of LC OP services and will contact PRN.  Maternal Data Formula Feeding for Exclusion: No Has patient been taught Hand Expression?: Yes Does the patient have breastfeeding experience prior to this delivery?: Yes  Feeding Feeding Type: Breast Milk  Interventions Interventions: Breast feeding basics reviewed;Breast massage;DEBP;Expressed milk;Support pillows;Hand pump  Lactation Tools Discussed/Used Tools: Other (comment)(curve tip syringe) WIC  Program: Yes Pump Review: Setup, frequency, and cleaning Initiated by:: RN Date initiated:: 09/23/17   Consult Status Consult Status: Complete Date: 09/23/17 Follow-up type: In-patient    Melonee Gerstel Venetia ConstableS Donzell Coller 09/23/2017, 7:49 PM

## 2017-09-23 NOTE — Discharge Summary (Signed)
OB Discharge Summary     Patient Name: Marie ReamerShacree E Rivera DOB: 19-Jul-1990 MRN: [id number]  Date of admission: 09/22/2017 Delivering MD: Lenor CoffinLSON, DANIEL K   Date of discharge: 09/23/2017  Admitting diagnosis: 38.5 WEEKS CTX Intrauterine pregnancy: 4827w5d     Secondary diagnosis:  Active Problems:   Uterine contractions during pregnancy  Additional problems: none     Discharge diagnosis: Term Pregnancy Delivered                                                                                                Post partum procedures:none  Augmentation: none  Complications: None  Hospital course:  Onset of Labor With Vaginal Delivery     27 y.o. yo [id number] at 7927w5d was admitted in Active Labor on 09/22/2017. Patient had an uncomplicated labor course as follows:  Membrane Rupture Time/Date: 6:51 AM ,09/22/2017   Intrapartum Procedures: Episiotomy: None [1]                                         Lacerations:  None [1]  Patient had a delivery of a Viable infant. 09/22/2017  Information for the patient's newborn:  Marylyn IshiharaHerbin, Girl [name] [[id number]][[id number]]  Delivery Method: Vag-Spont    Pateint had an uncomplicated postpartum course.  She is ambulating, tolerating a regular diet, passing flatus, and urinating well. Patient is discharged home in stable condition on 09/23/17.   Physical exam  Vitals:   09/22/17 1645 09/22/17 2100 09/23/17 0100 09/23/17 0500  BP: 114/62 115/65 110/68 100/61  Pulse: 84 (!) 58 64 77  Resp: 16 16 16 16   Temp: 99.1 F (37.3 C) 97.8 F (36.6 C) 98 F (36.7 C) 98.5 F (36.9 C)  TempSrc: Oral Oral Oral Oral  SpO2:  100% 100% 100%  Weight:      Height:       General: alert, cooperative and no distress Lochia: appropriate Uterine Fundus: firm Incision: N/A DVT Evaluation: No evidence of DVT seen on physical exam. Negative Homan's sign. No cords or calf tenderness. No significant calf/ankle edema. Labs: Lab Results  Component Value Date   WBC 12.7 (H)  09/22/2017   HGB 11.2 (L) 09/22/2017   HCT 35.2 (L) 09/22/2017   MCV 76.4 (L) 09/22/2017   PLT 268 09/22/2017   CMP Latest Ref Rng & Units 03/20/2016  Glucose 65 - 99 mg/dL 130(Q108(H)  BUN 6 - 20 mg/dL 11  Creatinine 6.570.44 - 8.461.00 mg/dL 9.620.63  Sodium 952135 - 841145 mmol/L 137  Potassium 3.5 - 5.1 mmol/L 3.3(L)  Chloride 101 - 111 mmol/L 105  CO2 22 - 32 mmol/L 26  Calcium 8.9 - 10.3 mg/dL 3.2(G8.4(L)  Total Protein 6.5 - 8.1 g/dL 7.1  Total Bilirubin 0.3 - 1.2 mg/dL 4.0(N0.2(L)  Alkaline Phos 38 - 126 U/L 70  AST 15 - 41 U/L 18  ALT 14 - 54 U/L 28    Discharge instruction: per After Visit Summary and "Baby and Me Booklet".  After visit meds:  Allergies as of  09/23/2017   No Known Allergies     Medication List    STOP taking these medications   acetaminophen 325 MG tablet Commonly known as:  TYLENOL   ferrous sulfate 325 (65 FE) MG tablet     TAKE these medications   ibuprofen 600 MG tablet Commonly known as:  ADVIL,MOTRIN Take 1 tablet (600 mg total) by mouth every 6 (six) hours.   PRENATAL GUMMIES/DHA & FA 0.4-32.5 MG Chew Chew 2 tablets by mouth daily.       Diet: routine diet  Activity: Advance as tolerated. Pelvic rest for 6 weeks.   Outpatient follow up:6 weeks Follow up Appt: Future Appointments  Date Time Provider Department Center  10/22/2017  2:30 PM Cheral MarkerBooker, Kimberly R, CNM [facility]   Follow up Visit:No follow-ups on file.  Postpartum contraception: Nexplanon  Newborn Data: Live born female  Birth Weight: 8 lb 8.5 oz (3870 g) APGAR: 7, 9  Newborn Delivery   Birth date/time:  09/22/2017 10:17:00 Delivery type:  Vaginal, Spontaneous     Baby Feeding: Breast Disposition:home with mother   09/23/2017 Raelyn Moraolitta Deetra Booton, CNM

## 2017-09-24 ENCOUNTER — Encounter: Payer: BLUE CROSS/BLUE SHIELD | Admitting: Advanced Practice Midwife

## 2017-09-28 ENCOUNTER — Telehealth: Payer: Self-pay | Admitting: *Deleted

## 2017-09-28 NOTE — Telephone Encounter (Signed)
[facility] that she delivered her baby on [facility]/13. She is taking Tylenol and Ibuprofen for her pain and reports that these medications are not helping. Pt requests a different Rx for pain.

## 2017-09-29 NOTE — Telephone Encounter (Signed)
Patient states she is having intense cramping and is requesting pain medication.  Informed patient if she is breastfeeding, she will having intense cramping and the more children she has, the more cramping.  Informed she had a normal vaginal delivery with no tears and pain medication is not usually given.  Encouraged patient to empty bladder frequently and to continue taking ibuprofen. Advised if bleeding increased or she started having clots, to let us know.  Verbalized understanding.

## 2017-10-22 ENCOUNTER — Ambulatory Visit: Payer: BLUE CROSS/BLUE SHIELD | Admitting: Women's Health

## 2017-10-26 ENCOUNTER — Encounter: Payer: Self-pay | Admitting: *Deleted

## 2017-11-05 ENCOUNTER — Encounter: Payer: Self-pay | Admitting: Women's Health

## 2017-11-05 ENCOUNTER — Ambulatory Visit (INDEPENDENT_AMBULATORY_CARE_PROVIDER_SITE_OTHER): Payer: BLUE CROSS/BLUE SHIELD | Admitting: Women's Health

## 2017-11-05 NOTE — Progress Notes (Signed)
   POSTPARTUM VISIT Patient name: Marie Rivera MRN [id number]  Date of birth: Jan 03, 1991 Chief Complaint:   postpartum visit (interested in Nexplanon)  History of Present Illness:   Marie Rivera is a 27 y.o. G49P2002 African American female being seen today for a postpartum visit. She is 6 weeks postpartum following a spontaneous vaginal delivery at 38.5 gestational weeks. Anesthesia: epidural. I have fully reviewed the prenatal and intrapartum course. Pregnancy complicated by short cx. Postpartum course has been uncomplicated. Bleeding no bleeding. Bowel function is normal. Bladder function is normal.  Patient is sexually active. Last sexual activity: 9/24.  Contraception method is none and wants nexplanon.  [place] Postpartum Depression Screening: negative. Score 0.   Last pap 07/22/17.  Results were normal .  No LMP recorded.  Baby's course has been uncomplicated. Baby is feeding by breast & bottle.  Review of Systems:   Pertinent items are noted in HPI Denies Abnormal vaginal discharge w/ itching/odor/irritation, headaches, visual changes, shortness of breath, chest pain, abdominal pain, severe nausea/vomiting, or problems with urination or bowel movements. Pertinent History Reviewed:  Reviewed past medical,surgical, obstetrical and family history.  Reviewed problem list, medications and allergies. OB History  Gravida Para Term Preterm AB Living  2 2 2     2   SAB TAB Ectopic Multiple Live Births        0 2    # Outcome Date GA Lbr Len/2nd Weight Sex Delivery Anes PTL Lv  2 Term 09/22/17 [redacted]w[redacted]d 03:01 / 01:16 8 lb 8.5 oz (3.87 kg) F Vag-Spont EPI  LIV  1 Term 07/25/13 [redacted]w[redacted]d 07:12 / 02:40 7 lb 0.3 oz (3.184 kg) M Vag-Spont EPI  LIV   Physical Assessment:   Vitals:   11/05/17 1130  BP: (!) 98/57  Pulse: 86  Weight: 167 lb 8 oz (76 kg)  Height: 5\' 2"  (1.575 m)  Body mass index is 30.64 kg/m.       Physical Examination:   General appearance: alert, well appearing, and in  no distress  Mental status: alert, oriented to person, place, and time  Skin: warm & dry   Cardiovascular: normal heart rate noted   Respiratory: normal respiratory effort, no distress   Breasts: deferred, no complaints   Abdomen: soft, non-tender   Pelvic: VULVA: normal appearing vulva with no masses, tenderness or lesions, UTERUS: uterus is normal size, shape, consistency and nontender  Rectal: no hemorrhoids  Extremities: no edema       No results found for this or any previous visit (from the past 24 hour(s)).  Assessment & Plan:  1) Postpartum exam 2) 6 wks s/p SVB 3) Breast & bottlefeeding 4) Depression screening 5) Contraception counseling, pt prefers abstinence until nexplanon  Meds: No orders of the defined types were placed in this encounter.   Follow-up: Return for 10/4 am bhcg/pm nexplanon.   No orders of the defined types were placed in this encounter.   Cheral Marker CNM, [facility] 11/05/2017 11:51 AM

## 2017-11-05 NOTE — Patient Instructions (Signed)
NO SEX UNTIL AFTER YOU GET YOUR BIRTH CONTROL   Etonogestrel implant What is this medicine? ETONOGESTREL (et oh noe JES trel) is a contraceptive (birth control) device. It is used to prevent pregnancy. It can be used for up to 3 years. This medicine may be used for other purposes; ask your health care provider or pharmacist if you have questions. COMMON BRAND NAME(S): Implanon, Nexplanon What should I tell my health care provider before I take this medicine? They need to know if you have any of these conditions: -abnormal vaginal bleeding -blood vessel disease or blood clots -cancer of the breast, cervix, or liver -depression -diabetes -gallbladder disease -headaches -heart disease or recent heart attack -high blood pressure -high cholesterol -kidney disease -liver disease -renal disease -seizures -tobacco smoker -an unusual or allergic reaction to etonogestrel, other hormones, anesthetics or antiseptics, medicines, foods, dyes, or preservatives -pregnant or trying to get pregnant -breast-feeding How should I use this medicine? This device is inserted just under the skin on the inner side of your upper arm by a health care professional. Talk to your pediatrician regarding the use of this medicine in children. Special care may be needed. Overdosage: If you think you have taken too much of this medicine contact a poison control center or emergency room at once. NOTE: This medicine is only for you. Do not share this medicine with others. What if I miss a dose? This does not apply. What may interact with this medicine? Do not take this medicine with any of the following medications: -amprenavir -bosentan -fosamprenavir This medicine may also interact with the following medications: -barbiturate medicines for inducing sleep or treating seizures -certain medicines for fungal infections like ketoconazole and itraconazole -grapefruit juice -griseofulvin -medicines to treat  seizures like carbamazepine, felbamate, oxcarbazepine, phenytoin, topiramate -modafinil -phenylbutazone -rifampin -rufinamide -some medicines to treat HIV infection like atazanavir, indinavir, lopinavir, nelfinavir, tipranavir, ritonavir -St. John's wort This list may not describe all possible interactions. Give your health care provider a list of all the medicines, herbs, non-prescription drugs, or dietary supplements you use. Also tell them if you smoke, drink alcohol, or use illegal drugs. Some items may interact with your medicine. What should I watch for while using this medicine? This product does not protect you against HIV infection (AIDS) or other sexually transmitted diseases. You should be able to feel the implant by pressing your fingertips over the skin where it was inserted. Contact your doctor if you cannot feel the implant, and use a non-hormonal birth control method (such as condoms) until your doctor confirms that the implant is in place. If you feel that the implant may have broken or become bent while in your arm, contact your healthcare provider. What side effects may I notice from receiving this medicine? Side effects that you should report to your doctor or health care professional as soon as possible: -allergic reactions like skin rash, itching or hives, swelling of the face, lips, or tongue -breast lumps -changes in emotions or moods -depressed mood -heavy or prolonged menstrual bleeding -pain, irritation, swelling, or bruising at the insertion site -scar at site of insertion -signs of infection at the insertion site such as fever, and skin redness, pain or discharge -signs of pregnancy -signs and symptoms of a blood clot such as breathing problems; changes in vision; chest pain; severe, sudden headache; pain, swelling, warmth in the leg; trouble speaking; sudden numbness or weakness of the face, arm or leg -signs and symptoms of liver injury like dark yellow   or brown  urine; general ill feeling or flu-like symptoms; light-colored stools; loss of appetite; nausea; right upper belly pain; unusually weak or tired; yellowing of the eyes or skin -unusual vaginal bleeding, discharge -signs and symptoms of a stroke like changes in vision; confusion; trouble speaking or understanding; severe headaches; sudden numbness or weakness of the face, arm or leg; trouble walking; dizziness; loss of balance or coordination Side effects that usually do not require medical attention (report to your doctor or health care professional if they continue or are bothersome): -acne -back pain -breast pain -changes in weight -dizziness -general ill feeling or flu-like symptoms -headache -irregular menstrual bleeding -nausea -sore throat -vaginal irritation or inflammation This list may not describe all possible side effects. Call your doctor for medical advice about side effects. You may report side effects to FDA at 1-800-FDA-1088. Where should I keep my medicine? This drug is given in a hospital or clinic and will not be stored at home. NOTE: This sheet is a summary. It may not cover all possible information. If you have questions about this medicine, talk to your doctor, pharmacist, or health care provider.  2018 Elsevier/Gold Standard (2015-08-16 11:19:22)  

## 2017-11-13 ENCOUNTER — Other Ambulatory Visit: Payer: BLUE CROSS/BLUE SHIELD

## 2017-11-13 ENCOUNTER — Encounter: Payer: BLUE CROSS/BLUE SHIELD | Admitting: Women's Health

## 2017-11-13 DIAGNOSIS — Z3046 Encounter for surveillance of implantable subdermal contraceptive: Secondary | ICD-10-CM | POA: Diagnosis not present

## 2017-11-13 LAB — BETA HCG QUANT (REF LAB): hCG Quant: <1 m[IU]/mL

## 2017-11-16 NOTE — Progress Notes (Signed)
This encounter was created in error - please disregard. Pt scheduled for nexplanon, but had sex last night.  Cheral Marker, CNM, [facility] 11/16/2017 8:56 AM

## 2017-11-27 ENCOUNTER — Encounter: Payer: BLUE CROSS/BLUE SHIELD | Admitting: Women's Health

## 2017-12-04 ENCOUNTER — Ambulatory Visit (INDEPENDENT_AMBULATORY_CARE_PROVIDER_SITE_OTHER): Payer: BLUE CROSS/BLUE SHIELD | Admitting: Women's Health

## 2017-12-04 ENCOUNTER — Encounter: Payer: Self-pay | Admitting: Women's Health

## 2017-12-04 VITALS — BP 114/61 | HR 66 | Wt 176.0 lb

## 2017-12-04 DIAGNOSIS — Z3009 Encounter for other general counseling and advice on contraception: Secondary | ICD-10-CM

## 2017-12-04 NOTE — Patient Instructions (Signed)
NO SEX  

## 2017-12-04 NOTE — Progress Notes (Signed)
   GYN VISIT Patient name: Marie Rivera MRN [id number]  Date of birth: [birth year] Chief Complaint:   Contraception (Neplanon insertion)  History of Present Illness:   Marie Rivera is a 27 y.o. G66P2002 African American female being seen today initially for nexplanon insertion, however she had sex on 10/20. Discussed other options that we could do today, however she still wants nexplanon. Knew she wasn't supposed to have sex, states it wasn't her fault.  Has not been using protection.  No longer breastfeeding, hasn't had period yet.   No LMP recorded. Review of Systems:   Pertinent items are noted in HPI Denies fever/chills, dizziness, headaches, visual disturbances, fatigue, shortness of breath, chest pain, abdominal pain, vomiting, abnormal vaginal discharge/itching/odor/irritation, problems with periods, bowel movements, urination, or intercourse unless otherwise stated above.  Pertinent History Reviewed:  Reviewed past medical,surgical, social, obstetrical and family history.  Reviewed problem list, medications and allergies. Physical Assessment:   Vitals:   12/04/17 1046  BP: 114/61  Pulse: 66  Weight: 176 lb (79.8 kg)  Body mass index is 32.19 kg/m.       Physical Examination:   General appearance: alert, well appearing, and in no distress  Mental status: alert, oriented to person, place, and time  Skin: warm & dry   Cardiovascular: normal heart rate noted  Respiratory: normal respiratory effort, no distress  Abdomen: soft, non-tender   Pelvic: examination not indicated  Extremities: no edema   No results found for this or any previous visit (from the past 24 hour(s)).  Assessment & Plan:  1) Contraception counseling> unable to place nexplanon today d/t unprotected sex on 10/20, hasn't had period yet since baby born. Abstinence until after nexplanon placed, return 11/4 am for bhcg, then pm for insertion.   Meds: No orders of the defined types were placed in this  encounter.   No orders of the defined types were placed in this encounter.   Return for 11/4 FOR AM HCG, PM NEXPLANON.  Cheral Marker CNM, [facility] 12/04/2017 11:13 AM

## 2017-12-14 ENCOUNTER — Other Ambulatory Visit: Payer: BLUE CROSS/BLUE SHIELD

## 2017-12-14 ENCOUNTER — Encounter: Payer: BLUE CROSS/BLUE SHIELD | Admitting: Women's Health

## 2018-03-02 DIAGNOSIS — S199XXA Unspecified injury of neck, initial encounter: Secondary | ICD-10-CM | POA: Diagnosis not present

## 2018-03-02 DIAGNOSIS — F17200 Nicotine dependence, unspecified, uncomplicated: Secondary | ICD-10-CM | POA: Diagnosis not present

## 2018-03-02 DIAGNOSIS — S12400A Unspecified displaced fracture of fifth cervical vertebra, initial encounter for closed fracture: Secondary | ICD-10-CM | POA: Diagnosis not present

## 2018-03-02 DIAGNOSIS — M542 Cervicalgia: Secondary | ICD-10-CM | POA: Diagnosis not present

## 2018-03-02 DIAGNOSIS — S12490A Other displaced fracture of fifth cervical vertebra, initial encounter for closed fracture: Secondary | ICD-10-CM | POA: Diagnosis not present

## 2018-03-05 DIAGNOSIS — Z6831 Body mass index (BMI) 31.0-31.9, adult: Secondary | ICD-10-CM | POA: Diagnosis not present

## 2018-03-05 DIAGNOSIS — S129XXA Fracture of neck, unspecified, initial encounter: Secondary | ICD-10-CM | POA: Diagnosis not present

## 2018-03-05 DIAGNOSIS — R030 Elevated blood-pressure reading, without diagnosis of hypertension: Secondary | ICD-10-CM | POA: Diagnosis not present

## 2018-03-24 ENCOUNTER — Emergency Department (HOSPITAL_COMMUNITY)
Admission: EM | Admit: 2018-03-24 | Discharge: 2018-03-24 | Disposition: A | Discharge status: Home / Self Care | Payer: BLUE CROSS/BLUE SHIELD | Attending: Emergency Medicine | Admitting: Emergency Medicine

## 2018-03-24 ENCOUNTER — Other Ambulatory Visit: Payer: Self-pay

## 2018-03-24 ENCOUNTER — Encounter (HOSPITAL_COMMUNITY): Payer: Self-pay | Admitting: Emergency Medicine

## 2018-03-24 DIAGNOSIS — J111 Influenza due to unidentified influenza virus with other respiratory manifestations: Secondary | ICD-10-CM | POA: Diagnosis not present

## 2018-03-24 DIAGNOSIS — F17210 Nicotine dependence, cigarettes, uncomplicated: Secondary | ICD-10-CM | POA: Diagnosis not present

## 2018-03-24 DIAGNOSIS — R69 Illness, unspecified: Secondary | ICD-10-CM

## 2018-03-24 DIAGNOSIS — R509 Fever, unspecified: Secondary | ICD-10-CM | POA: Diagnosis present

## 2018-03-24 MED ORDER — IBUPROFEN 800 MG PO TABS
800.0000 mg | ORAL_TABLET | Freq: Once | ORAL | Status: AC
  Administered 2018-03-24: 800 mg via ORAL

## 2018-03-24 MED ORDER — IBUPROFEN 800 MG PO TABS
ORAL_TABLET | ORAL | Status: AC
  Filled 2018-03-24: qty 1

## 2018-03-24 MED ORDER — OSELTAMIVIR PHOSPHATE 75 MG PO CAPS: 75.0000 mg | ORAL_CAPSULE | Freq: Two times a day (BID) | ORAL | 0 refills | Status: DC

## 2018-03-24 NOTE — ED Provider Notes (Signed)
[facility] EMERGENCY DEPARTMENT Provider Note   CSN: [id number] Arrival date & time: 03/24/18  1557     History   Chief Complaint Chief Complaint  Patient presents with  . Generalized Body Aches    HPI Marie Rivera is a 28 y.o. female.  Patient complains of fever and aches.  This started today.  No vomiting no cough no sore throat  The history is provided by the patient. No language interpreter was used.  Fever  Max temp prior to arrival:  101 Temp source:  Oral Severity:  Moderate Onset quality:  Sudden Duration:  12 hours Timing:  Constant Progression:  Worsening Chronicity:  New Relieved by:  Nothing Worsened by:  Nothing Ineffective treatments:  None tried Associated symptoms: no chest pain, no congestion, no cough, no diarrhea, no headaches and no rash     History reviewed. No pertinent past medical history.  Patient Active Problem List   Diagnosis Date Noted  . 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 History of prior pregnancy with short cervix, currently pregnant 04/18/2013    Past Surgical History:  Procedure Laterality Date  . DENTAL SURGERY    . NO PAST SURGERIES       OB History    Gravida  2   Para  2   Term  2   Preterm      AB      Living  2     SAB      TAB      Ectopic      Multiple  0   Live Births  2            Home Medications    Prior to Admission medications   Medication Sig Start Date End Date Taking? Authorizing Provider  ibuprofen (ADVIL,MOTRIN) 400 MG tablet Take 400 mg by mouth every 8 (eight) hours as needed for moderate pain.  03/03/18  Yes [provider]  oseltamivir (TAMIFLU) 75 MG capsule Take 1 capsule (75 mg total) by mouth every 12 (twelve) hours. 03/24/18   Bethann Berkshire, MD    Family History Family History  Problem Relation Age of Onset  . Diabetes Mother   . Hypertension Mother   . Diabetes Maternal Grandmother   . Cancer Maternal Aunt   . Cancer Paternal  Aunt     Social History Social History   Tobacco Use  . Smoking status: Current Every Day Smoker    Packs/day: 0.02    Years: 2.00    Pack years: 0.04    Types: Cigarettes  . Smokeless tobacco: Never Used  Substance Use Topics  . Alcohol use: No  . Drug use: No     Allergies   Patient has no known allergies.   Review of Systems Review of Systems  Constitutional: Positive for fever. Negative for appetite change and fatigue.  HENT: Negative for congestion, ear discharge and sinus pressure.   Eyes: Negative for discharge.  Respiratory: Negative for cough.   Cardiovascular: Negative for chest pain.  Gastrointestinal: Negative for abdominal pain and diarrhea.  Genitourinary: Negative for frequency and hematuria.  Musculoskeletal: Negative for back pain.  Skin: Negative for rash.  Neurological: Negative for seizures and headaches.  Psychiatric/Behavioral: Negative for hallucinations.     Physical Exam Updated Vital Signs BP 104/87   Pulse (!) 106   Temp (!) 101.6 F (38.7 C)   Resp 20   Ht 5\' 2"  (1.575 m)   Wt 80.7 kg  LMP 03/16/2018   SpO2 98%   BMI 32.56 kg/m   Physical Exam Vitals signs and nursing note reviewed.  Constitutional:      Appearance: She is well-developed.  HENT:     Head: Normocephalic.     Nose: Nose normal.  Eyes:     General: No scleral icterus.    Conjunctiva/sclera: Conjunctivae normal.  Neck:     Musculoskeletal: Neck supple.     Thyroid: No thyromegaly.  Cardiovascular:     Rate and Rhythm: Normal rate and regular rhythm.     Heart sounds: No murmur. No friction rub. No gallop.   Pulmonary:     Breath sounds: No stridor. No wheezing or rales.  Chest:     Chest wall: No tenderness.  Abdominal:     General: There is no distension.     Tenderness: There is no abdominal tenderness. There is no rebound.  Musculoskeletal: Normal range of motion.  Lymphadenopathy:     Cervical: No cervical adenopathy.  Skin:    Findings: No  erythema or rash.  Neurological:     Mental Status: She is oriented to person, place, and time.     Motor: No abnormal muscle tone.     Coordination: Coordination normal.  Psychiatric:        Behavior: Behavior normal.      ED Treatments / Results  Labs (all labs ordered are listed, but only abnormal results are displayed) Labs Reviewed - No data to display  EKG None  Radiology No results found.  Procedures Procedures (including critical care time)  Medications Ordered in ED Medications  ibuprofen (ADVIL,MOTRIN) 800 MG tablet (has no administration in time range)  ibuprofen (ADVIL,MOTRIN) tablet 800 mg (800 mg Oral Given 03/24/18 1658)     Initial Impression / Assessment and Plan / ED Course  I have reviewed the triage vital signs and the nursing notes.  Pertinent labs & imaging results that were available during my care of the patient were reviewed by me and considered in my medical decision making (see chart for details).     Patient with fever and aches.  Most likely influenza.  She will be placed on Tamiflu told to take Tylenol Motrin and follow-up with her family doctor if not improving  Final Clinical Impressions(s) / ED Diagnoses   Final diagnoses:  Influenza-like illness    ED Discharge Orders         Ordered    oseltamivir (TAMIFLU) 75 MG capsule  Every 12 hours     03/24/18 1745           Bethann BerkshireZammit, Xavior Niazi, MD 03/24/18 1751

## 2018-03-24 NOTE — ED Triage Notes (Addendum)
PT c/o generalized body aches,  fever 101.6 today with no OTC medications starting last night after dinner and states family with same symptoms at ED today being seen as well.

## 2018-03-24 NOTE — Discharge Instructions (Addendum)
Drink plenty of fluids and take Tylenol or Motrin for pain.  Or fever.  Follow-up next week if not improving rest at home 1 to 2 days

## 2018-04-14 DIAGNOSIS — Z3009 Encounter for other general counseling and advice on contraception: Secondary | ICD-10-CM | POA: Diagnosis not present

## 2018-04-14 DIAGNOSIS — Z30011 Encounter for initial prescription of contraceptive pills: Secondary | ICD-10-CM | POA: Diagnosis not present

## 2018-05-31 DIAGNOSIS — Z30017 Encounter for initial prescription of implantable subdermal contraceptive: Secondary | ICD-10-CM | POA: Diagnosis not present

## 2018-06-28 DIAGNOSIS — Z3046 Encounter for surveillance of implantable subdermal contraceptive: Secondary | ICD-10-CM | POA: Diagnosis not present

## 2018-06-28 DIAGNOSIS — N760 Acute vaginitis: Secondary | ICD-10-CM | POA: Diagnosis not present

## 2019-01-31 ENCOUNTER — Other Ambulatory Visit: Payer: Self-pay

## 2019-01-31 ENCOUNTER — Ambulatory Visit: Payer: BC Managed Care – PPO | Attending: Internal Medicine

## 2019-01-31 DIAGNOSIS — Z20828 Contact with and (suspected) exposure to other viral communicable diseases: Secondary | ICD-10-CM | POA: Insufficient documentation

## 2019-01-31 DIAGNOSIS — Z20822 Contact with and (suspected) exposure to covid-19: Secondary | ICD-10-CM

## 2019-02-01 LAB — NOVEL CORONAVIRUS, NAA: SARS-CoV-2, NAA: NOT DETECTED

## 2019-06-28 ENCOUNTER — Ambulatory Visit: Payer: BLUE CROSS/BLUE SHIELD

## 2019-06-28 DIAGNOSIS — Z20822 Contact with and (suspected) exposure to covid-19: Secondary | ICD-10-CM

## 2019-06-29 LAB — SARS-COV-2, NAA 2 DAY TAT

## 2019-06-29 LAB — NOVEL CORONAVIRUS, NAA: SARS-CoV-2, NAA: NOT DETECTED

## 2019-09-09 ENCOUNTER — Encounter (HOSPITAL_COMMUNITY): Payer: Self-pay | Admitting: *Deleted

## 2019-09-09 ENCOUNTER — Emergency Department (HOSPITAL_COMMUNITY): Payer: BLUE CROSS/BLUE SHIELD

## 2019-09-09 ENCOUNTER — Emergency Department (HOSPITAL_COMMUNITY)
Admission: EM | Admit: 2019-09-09 | Discharge: 2019-09-09 | Disposition: A | Discharge status: Home / Self Care | Payer: BLUE CROSS/BLUE SHIELD | Attending: Emergency Medicine | Admitting: Emergency Medicine

## 2019-09-09 ENCOUNTER — Other Ambulatory Visit: Payer: Self-pay

## 2019-09-09 DIAGNOSIS — M79672 Pain in left foot: Secondary | ICD-10-CM | POA: Diagnosis present

## 2019-09-09 DIAGNOSIS — M791 Myalgia, unspecified site: Secondary | ICD-10-CM | POA: Insufficient documentation

## 2019-09-09 DIAGNOSIS — F17210 Nicotine dependence, cigarettes, uncomplicated: Secondary | ICD-10-CM | POA: Diagnosis not present

## 2019-09-09 DIAGNOSIS — M722 Plantar fascial fibromatosis: Secondary | ICD-10-CM | POA: Diagnosis not present

## 2019-09-09 NOTE — Discharge Instructions (Addendum)
Follow instructions regarding exercises for your plantar fasciitis. You can take medication such as ibuprofen or Aleve to help with pain and inflammation. Follow-up with the specialist listed below. Return to the ER for worsening pain, if you develop joint swelling, redness or warmth of your joint, injuries or falls.

## 2019-09-09 NOTE — ED Notes (Signed)
Complains of L foot pain x 1 month  Ambulates heel to toe without and change of an easy, even gait   Here for eval

## 2019-09-09 NOTE — ED Provider Notes (Signed)
[facility] EMERGENCY DEPARTMENT Provider Note   CSN: [id number] Arrival date & time: 09/09/19  1658     History Chief Complaint  Patient presents with   Foot Pain    Marie Rivera is a 29 y.o. female who presents to ED with a chief complaint of left foot pain.  Reports intermittent aching pain to the bottom of her left foot for the past month.  1 week ago started having pain that has worsened and feels "throbbing."  States that after a long day of work, she started having pain that radiated up her leg.  She has not tried any medications to help with pain.  No injury or trauma.  She denies any prior foot surgeries, numbness, joint swelling, fever or leg swelling.  HPI     History reviewed. No pertinent past medical history.  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   History of prior pregnancy with short cervix, currently pregnant 04/18/2013    Past Surgical History:  Procedure Laterality Date   DENTAL SURGERY     NO PAST SURGERIES       OB History    Gravida  2   Para  2   Term  2   Preterm      AB      Living  2     SAB      TAB      Ectopic      Multiple  0   Live Births  2           Family History  Problem Relation Age of Onset   Diabetes Mother    Hypertension Mother    Diabetes Maternal Grandmother    Cancer Maternal Aunt    Cancer Paternal Aunt     Social History   Tobacco Use   Smoking status: Current Every Day Smoker    Packs/day: 0.02    Years: 2.00    Pack years: 0.04    Types: Cigarettes   Smokeless tobacco: Never Used  Building services engineer Use: Never used  Substance Use Topics   Alcohol use: No   Drug use: No    Home Medications Prior to Admission medications   Medication Sig Start Date End Date Taking? Authorizing Provider  ibuprofen (ADVIL,MOTRIN) 400 MG tablet Take 400 mg by mouth every 8 (eight) hours as needed for moderate pain.   03/03/18   [provider]  oseltamivir (TAMIFLU) 75 MG capsule Take 1 capsule (75 mg total) by mouth every 12 (twelve) hours. 03/24/18   Bethann Berkshire, MD    Allergies    Patient has no known allergies.  Review of Systems   Review of Systems  Constitutional: Negative for chills and fever.  Musculoskeletal: Positive for myalgias.  Skin: Negative for wound.  Neurological: Negative for weakness and numbness.    Physical Exam Updated Vital Signs BP 98/69    Pulse 70    Temp 99.5 F (37.5 C)    Resp 20    Ht 5\' 2"  (1.575 m)    Wt 83.9 kg    LMP 09/09/2019    SpO2 98%    BMI 33.84 kg/m   Physical Exam Vitals and nursing note reviewed.  Constitutional:      General: She is not in acute distress.    Appearance: She is well-developed. She is not diaphoretic.  HENT:     Head: Normocephalic and atraumatic.  Eyes:     General: No scleral icterus.    Conjunctiva/sclera: Conjunctivae normal.  Pulmonary:     Effort: Pulmonary effort is normal. No respiratory distress.  Musculoskeletal:        General: Tenderness present.     Cervical back: Normal range of motion.     Comments: TTP of the plantar surface of the L foot.  No deformities noted.  No palpable mass.  No joint swelling, full active and passive range of motion of bilateral ankles and digits without difficulty.  2+ DP pulses noted bilaterally.  Skin:    Findings: No rash.  Neurological:     Mental Status: She is alert.     ED Results / Procedures / Treatments   Labs (all labs ordered are listed, but only abnormal results are displayed) Labs Reviewed - No data to display  EKG None  Radiology DG Foot Complete Left  Result Date: 09/09/2019 CLINICAL DATA:  Pain for 1 month in the LEFT foot EXAM: LEFT FOOT - COMPLETE 3+ VIEW COMPARISON:  None FINDINGS: There is no evidence of fracture or dislocation. There is no evidence of arthropathy or other focal bone abnormality. Soft tissues are unremarkable. IMPRESSION:  Negative evaluation of the LEFT foot. Electronically Signed   By: Donzetta Kohut M.D.   On: 09/09/2019 17:55    Procedures Procedures (including critical care time)  Medications Ordered in ED Medications - No data to display  ED Course  I have reviewed the triage vital signs and the nursing notes.  Pertinent labs & imaging results that were available during my care of the patient were reviewed by me and considered in my medical decision making (see chart for details).    MDM Rules/Calculators/A&P                          29 year old female presenting to the ED with a chief complaint of left foot pain.  Reports pain to the bottom of her left foot for the past month that worsened after a long day of work a few days ago.  She has not tried medications for pain.  Denies any numbness, joint swelling, weakness or prior procedures in the area.  On exam there is some tenderness of the plantar surface of the left foot.  No erythema, edema or warmth of joints noted.  Equal intact distal pulses noted bilateral lower extremities.  X-ray shows no acute findings.  Suspect that her symptoms are due to plantar fasciitis. Doubt infectious or vascular cause of her symptoms.  Will treat with exercises and stretches, NSAIDs and podiatry follow-up. She denies possibility of pregnancy.  All imaging, if done today, including plain films, CT scans, and ultrasounds, independently reviewed by me, and interpretations confirmed via formal radiology reads.  Patient is hemodynamically stable, in NAD, and able to ambulate in the ED. Evaluation does not show pathology that would require ongoing emergent intervention or inpatient treatment. I explained the diagnosis to the patient. Pain has been managed and has no complaints prior to discharge. Patient is comfortable with above plan and is stable for discharge at this time. All questions were answered prior to disposition. Strict return precautions for returning to the ED  were discussed. Encouraged follow up with PCP.   An After Visit Summary was printed and given to the patient.   Portions of this note were generated with Scientist, clinical (histocompatibility and immunogenetics). Dictation errors may occur despite best attempts at proofreading.  Final Clinical  Impression(s) / ED Diagnoses Final diagnoses:  Plantar fasciitis of left foot    Rx / DC Orders ED Discharge Orders    None       Dietrich Pates, PA-C 09/09/19 1810    Linwood Dibbles, MD 09/12/19 1050

## 2019-09-09 NOTE — ED Notes (Signed)
Pt reports she has no PCP  Works at [employer]  Pain is in her arch area   Wearing flats with  No arch support   Has tried no OTC remedies

## 2019-09-09 NOTE — ED Triage Notes (Signed)
Left foot pain x 1 month

## 2019-09-21 ENCOUNTER — Ambulatory Visit (INDEPENDENT_AMBULATORY_CARE_PROVIDER_SITE_OTHER): Payer: BLUE CROSS/BLUE SHIELD | Admitting: Podiatry

## 2019-09-21 ENCOUNTER — Other Ambulatory Visit: Payer: Self-pay

## 2019-09-21 ENCOUNTER — Ambulatory Visit: Payer: BLUE CROSS/BLUE SHIELD | Admitting: Podiatry

## 2019-09-21 DIAGNOSIS — M722 Plantar fascial fibromatosis: Secondary | ICD-10-CM | POA: Diagnosis not present

## 2019-09-21 DIAGNOSIS — M79672 Pain in left foot: Secondary | ICD-10-CM | POA: Diagnosis not present

## 2019-09-21 MED ORDER — MELOXICAM 15 MG PO TABS: 15.0000 mg | ORAL_TABLET | Freq: Every day | ORAL | 0 refills | Status: AC

## 2019-09-21 NOTE — Patient Instructions (Signed)

## 2019-09-21 NOTE — Progress Notes (Signed)
  Subjective:  Patient ID: Marie Rivera, female    DOB: 1990-04-20,  MRN: [id number]  Chief Complaint  Patient presents with  . Plantar Fasciitis    Bottom of L heel. x1 month. Pt stated, "I went to the ER due to the pain a couple of weeks ago. They took x-rays and diagnosed me with plantar fasciitis. They just told me to take ibuprofen and see a podiatrist. I work 12 hour shifts on my feet. When I sit for awhile and stand back up, my pain is 20/10".    29 y.o. female presents with the above complaint. History confirmed with patient.   Objective:  Physical Exam: warm, good capillary refill, no trophic changes or ulcerative lesions, normal DP and PT pulses and normal sensory exam. Left Foot: point tenderness of the mid plantar fascia laterally, no mass palpated, she says she often feels like there's a lump here though  Radiograph from [facility] ED: no s/o fracture, degenerative changes Assessment:   1. Pain in left foot   2. Plantar fasciitis      Plan:  Patient was evaluated and treated and all questions answered.   -XR reviewed with patient -Educated patient on stretching and icing of the affected limb -Plantar fascial brace dispensed -Rx for meloxicam. 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 This is reasonable and hopeful it will heal with rest. -No clear evidence today on exam of a plantar fascial tear or fibroma formation. No heel tenderness. Did not inject today.  -Will consider MRI if not improving next visit to evaluate for a plantar fibroma or tear  Return in about 4 weeks (around 10/19/2019) for recheck plantar fasciitis.

## 2019-10-20 ENCOUNTER — Other Ambulatory Visit: Payer: Self-pay

## 2019-10-20 ENCOUNTER — Ambulatory Visit (INDEPENDENT_AMBULATORY_CARE_PROVIDER_SITE_OTHER): Payer: BLUE CROSS/BLUE SHIELD | Admitting: Podiatry

## 2019-10-20 DIAGNOSIS — M79672 Pain in left foot: Secondary | ICD-10-CM

## 2019-10-20 DIAGNOSIS — M722 Plantar fascial fibromatosis: Secondary | ICD-10-CM | POA: Diagnosis not present

## 2019-10-22 NOTE — Progress Notes (Signed)
  Subjective:  Patient ID: Marie Rivera, female    DOB: 1990-11-27,  MRN: [id number]  Chief Complaint  Patient presents with  . Plantar Fasciitis    F/U Lt PF -pt states," pain has improvement, has not hurt as it used to; 4/10 occasional pain -w/ pulling sensation Tx: brace     29 y.o. female presents with the above complaint. History confirmed with patient.  Has been having issues with her medical leave paperwork in the gap from going to the ER to see me for the first time.  Overall she has had some improvement with pain.  Objective:  Physical Exam: warm, good capillary refill, no trophic changes or ulcerative lesions, normal DP and PT pulses and normal sensory exam.Minimal pain today heel bilaterally  Assessment:   1. Pain in left foot   2. Plantar fasciitis      Plan:  Patient was evaluated and treated and all questions answered.  -Continues using plantar fascial braces and meloxicam -I recommended her at this point she has improved but is not completely healed yet.  I will reevaluate her in 3 weeks and we will evaluate at that point if she is ready to go back to work or not. -She will discuss with our office regarding her medical leave paperwork.   Return in about 3 weeks (around 11/10/2019) for recheck plantar fasciitis.

## 2019-11-10 ENCOUNTER — Other Ambulatory Visit: Payer: Self-pay

## 2019-11-10 ENCOUNTER — Encounter: Payer: Self-pay | Admitting: Podiatry

## 2019-11-10 ENCOUNTER — Ambulatory Visit (INDEPENDENT_AMBULATORY_CARE_PROVIDER_SITE_OTHER): Payer: BLUE CROSS/BLUE SHIELD | Admitting: Podiatry

## 2019-11-10 DIAGNOSIS — M79672 Pain in left foot: Secondary | ICD-10-CM | POA: Diagnosis not present

## 2019-11-10 DIAGNOSIS — M722 Plantar fascial fibromatosis: Secondary | ICD-10-CM | POA: Diagnosis not present

## 2019-11-10 NOTE — Progress Notes (Signed)
  Subjective:  Patient ID: Marie Rivera, female    DOB: 05/24/1990,  MRN: [id number]  Chief Complaint  Patient presents with  . Plantar Fasciitis    left foot - 3 week follow up    29 y.o. female presents with the above complaint. History confirmed with patient.  Pain-free now.  She is ready to go back to work and needs documentation supporting this.  Objective:  Physical Exam: warm, good capillary refill, no trophic changes or ulcerative lesions, normal DP and PT pulses and normal sensory exam.no pain today heel bilaterally  Assessment:   1. Pain in left foot   2. Plantar fasciitis      Plan:  Patient was evaluated and treated and all questions answered.  -Cleared to return to work.  I advised her that she can wear her plantar fascial braces when she returns to help support it. -Continue stretching exercises as needed -Documentation regarding her return to work was provided.   Return if symptoms worsen or fail to improve.

## 2020-03-11 NOTE — Congregational Nurse Program (Signed)
Not insured;  told and explained the [facility] program Also told her the importance of having medical  Care. Information given regarding the free clinic. Jenene Slicker RN, [place], [phone]

## 2021-03-09 ENCOUNTER — Encounter (HOSPITAL_COMMUNITY): Payer: Self-pay

## 2021-03-09 ENCOUNTER — Emergency Department (HOSPITAL_COMMUNITY)
Admission: EM | Admit: 2021-03-09 | Discharge: 2021-03-10 | Disposition: A | Discharge status: Home / Self Care | Payer: BLUE CROSS/BLUE SHIELD | Attending: Emergency Medicine | Admitting: Emergency Medicine

## 2021-03-09 ENCOUNTER — Other Ambulatory Visit: Payer: Self-pay

## 2021-03-09 DIAGNOSIS — Z5321 Procedure and treatment not carried out due to patient leaving prior to being seen by health care provider: Secondary | ICD-10-CM | POA: Insufficient documentation

## 2021-03-09 DIAGNOSIS — M533 Sacrococcygeal disorders, not elsewhere classified: Secondary | ICD-10-CM | POA: Insufficient documentation

## 2021-03-09 NOTE — ED Triage Notes (Signed)
Pt presents with tailbone pain that started yesterday. Pt denies injury and is unknown of reason for pain. Describes pain as "pulling" . Pt states she is a lot of pain and can barley walk around. Took tylenol with no relief.

## 2021-03-10 NOTE — ED Notes (Signed)
Pt seen walking down the hall and asked if she was leaving and pt stated yes.

## 2021-08-22 IMAGING — DX DG FOOT COMPLETE 3+V*L*
3 series · 3 of 3 positions shown · non-contrast
Comparison: None

CLINICAL DATA: Pain for 1 month in the LEFT foot

EXAM:
LEFT FOOT - COMPLETE 3+ VIEW

[foot ap]
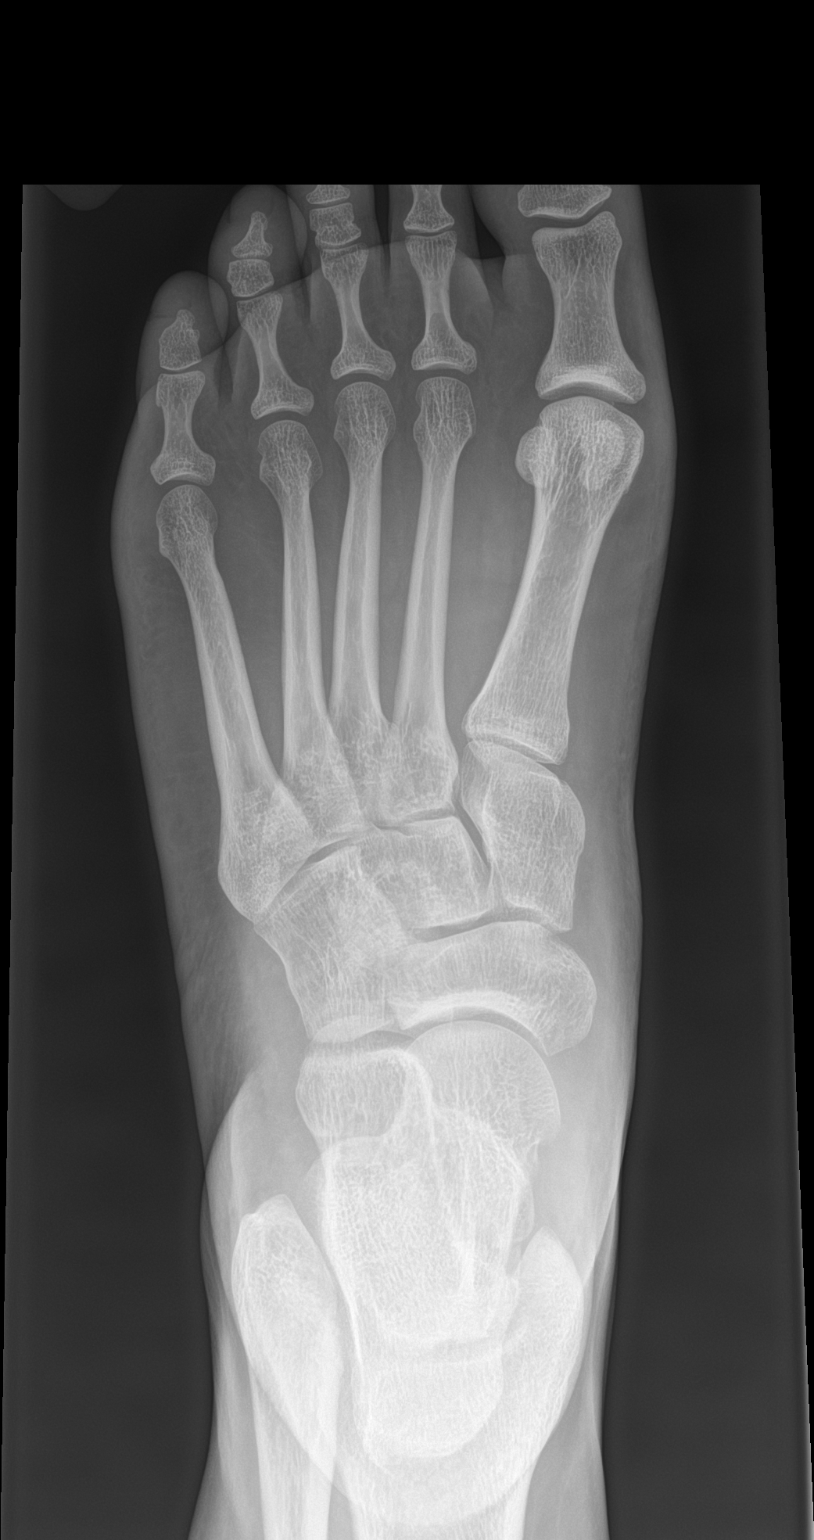

[foot obl]
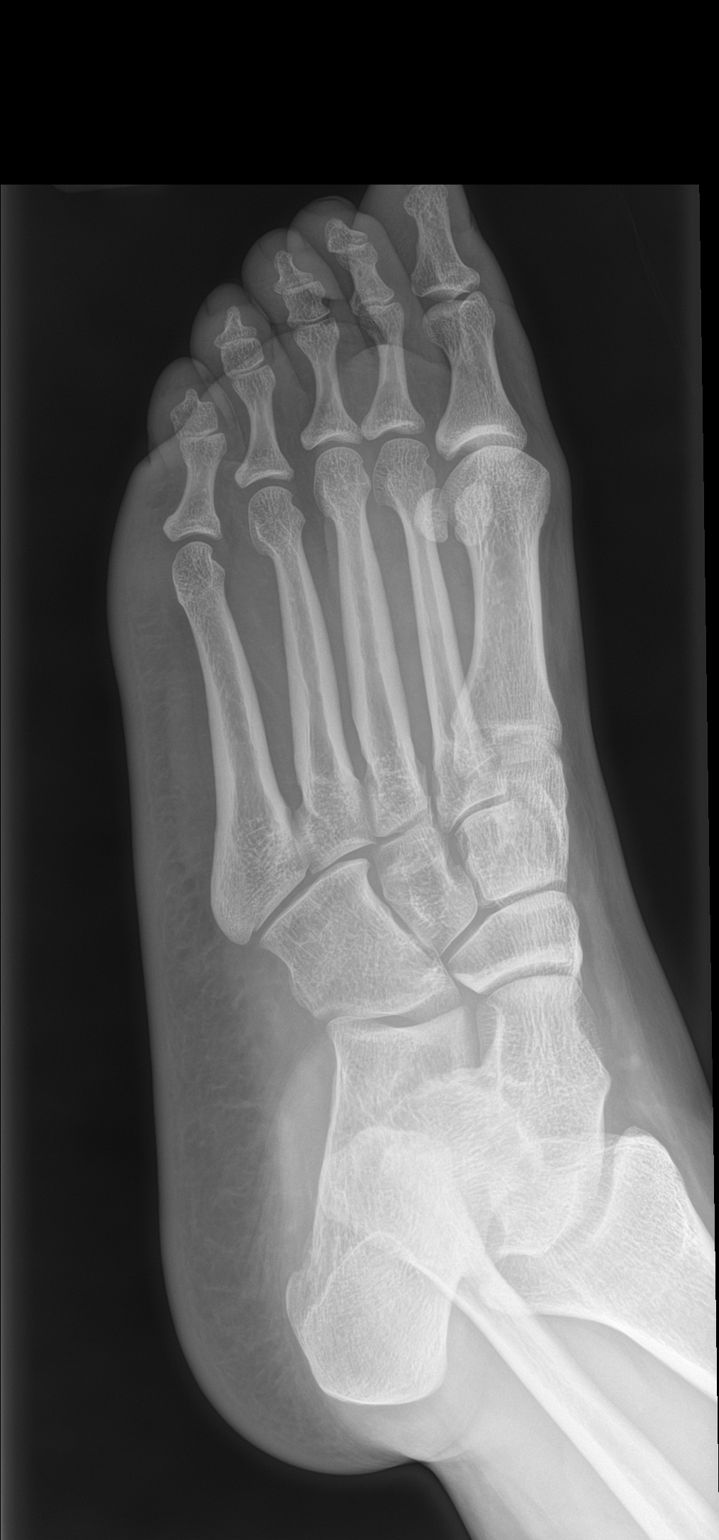

[foot lat]
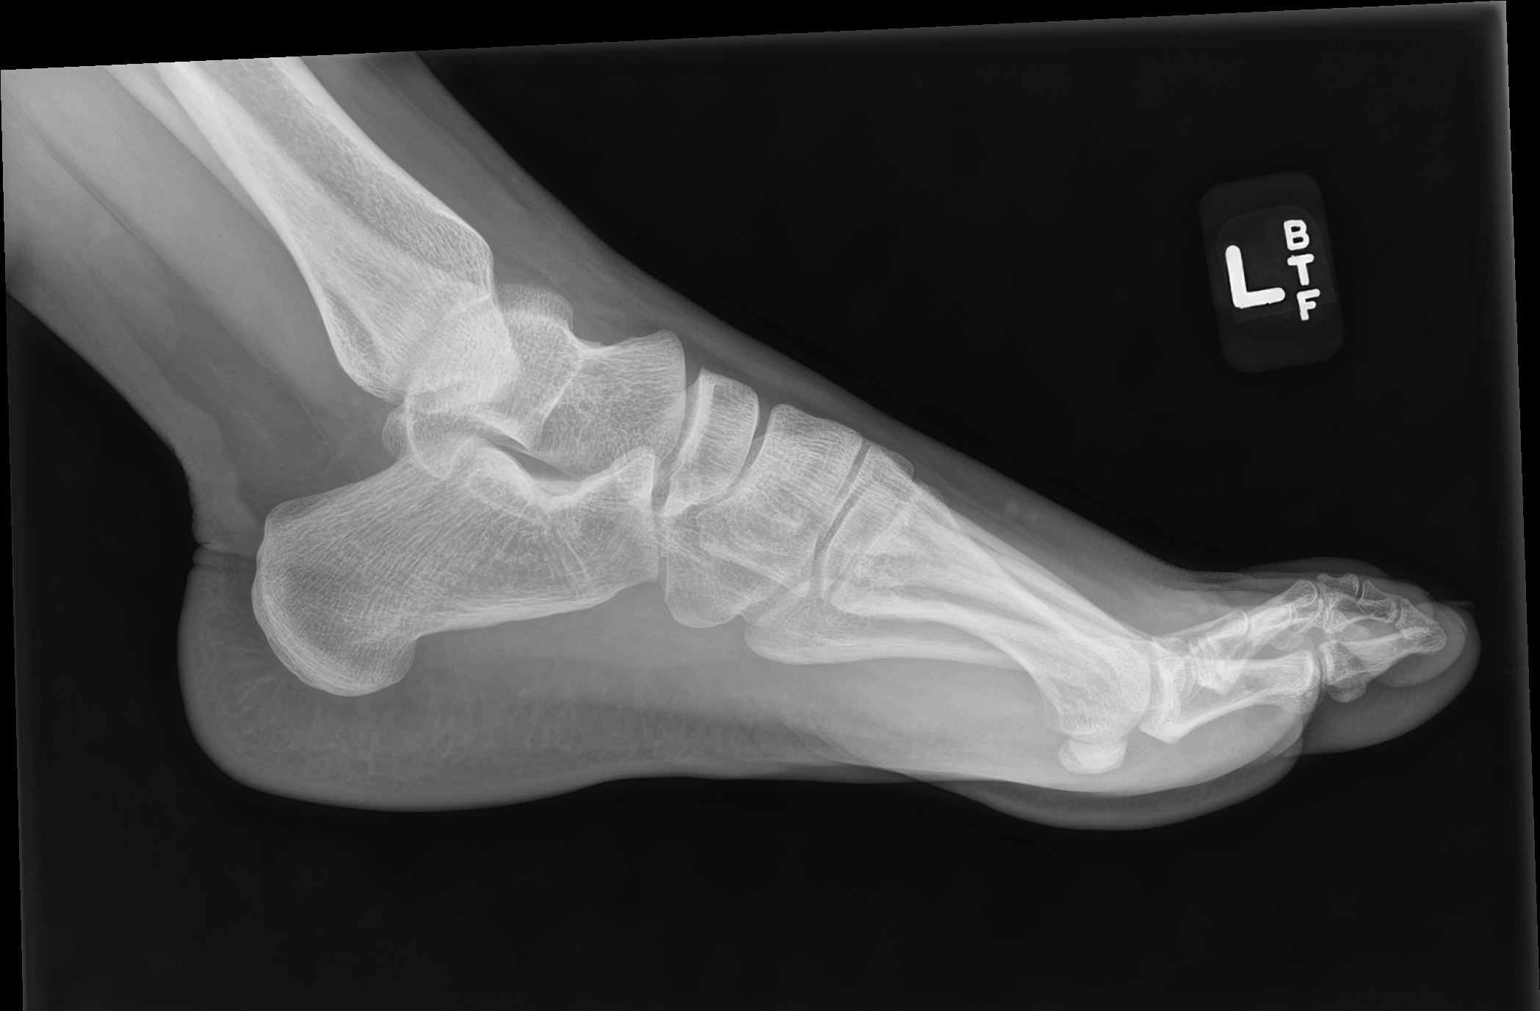

[3 of 3 positions shown; findings below may reference images not displayed]

FINDINGS: There is no evidence of fracture or dislocation. There is no
evidence of arthropathy or other focal bone abnormality. Soft
tissues are unremarkable.
IMPRESSION: Negative evaluation of the LEFT foot.

## 2022-05-02 ENCOUNTER — Emergency Department (HOSPITAL_COMMUNITY)
Admission: EM | Admit: 2022-05-02 | Discharge: 2022-05-02 | Disposition: A | Discharge status: Home / Self Care | Payer: BLUE CROSS/BLUE SHIELD | Attending: Emergency Medicine | Admitting: Emergency Medicine

## 2022-05-02 ENCOUNTER — Encounter (HOSPITAL_COMMUNITY): Payer: Self-pay

## 2022-05-02 ENCOUNTER — Other Ambulatory Visit: Payer: Self-pay

## 2022-05-02 DIAGNOSIS — Z3202 Encounter for pregnancy test, result negative: Secondary | ICD-10-CM | POA: Diagnosis present

## 2022-05-02 LAB — POC URINE PREG, ED: Preg Test, Ur: NEGATIVE

## 2022-05-02 NOTE — Discharge Instructions (Addendum)
Your pregnancy test is negative today.  I strongly suggest using a alternative form of birth control until you can get your Nexplanon updated, or you do risk getting pregnant.

## 2022-05-02 NOTE — ED Triage Notes (Signed)
Pt states she has implant in her arm for pregnancy that was supposed to be removed a month or so ago.  States she is feeling "fluttering" in her abdomin.  Reports negative home pregnancy test. Denies any complaints at time of triage.

## 2022-05-02 NOTE — ED Provider Notes (Signed)
[facility] Provider Note   CSN: [id number] Arrival date & time: 05/02/22  1530     History  No chief complaint on file.   Marie Rivera is a 32 y.o. female presenting for pregnancy test.  She states she is currently 1 or 2 months out of date from her Nexplanon implant and her work schedule is made it difficult for her to get to the health department to have this updated.  Over the past 5 days she has noted intermittent fleeting episodes of a fluttering sensation in her upper abdomen, lasting for several seconds which reminds her of pregnancy, with the exception of location.  She denies abdominal pain, nausea or vomiting, changes in bowel or bladder habits.  She is currently sexually active, not using any other form of birth control.  Denies vaginal complaints.  The history is provided by the patient.       Home Medications Prior to Admission medications   Medication Sig Start Date End Date Taking? Authorizing Provider  ibuprofen (ADVIL,MOTRIN) 400 MG tablet Take 400 mg by mouth every 8 (eight) hours as needed for moderate pain.  03/03/18   [provider]  meloxicam (MOBIC) 15 MG tablet Take 1 tablet (15 mg total) by mouth daily. 09/21/19   Criselda Peaches, DPM      Allergies    Patient has no known allergies.    Review of Systems   Review of Systems  Constitutional:  Negative for fever.  HENT:  Negative for congestion and sore throat.   Eyes: Negative.   Respiratory:  Negative for chest tightness and shortness of breath.   Cardiovascular:  Negative for chest pain.  Gastrointestinal:  Negative for abdominal pain, diarrhea, nausea and vomiting.  Genitourinary: Negative.   Musculoskeletal:  Negative for arthralgias, joint swelling and neck pain.  Skin: Negative.  Negative for rash and wound.  Neurological:  Negative for dizziness, weakness, light-headedness, numbness and headaches.  Psychiatric/Behavioral: Negative.       Physical Exam Updated Vital Signs BP 136/75 (BP Location: Right Arm)   Pulse 69   Temp 100 F (37.8 C) (Oral)   Resp 17   Ht 5\' 2"  (1.575 m)   Wt 89.2 kg   SpO2 98%   BMI 35.98 kg/m  Physical Exam Vitals and nursing note reviewed.  Constitutional:      Appearance: She is well-developed.  HENT:     Head: Normocephalic and atraumatic.  Eyes:     Conjunctiva/sclera: Conjunctivae normal.  Cardiovascular:     Rate and Rhythm: Normal rate and regular rhythm.     Heart sounds: Normal heart sounds.  Pulmonary:     Effort: Pulmonary effort is normal.     Breath sounds: Normal breath sounds. No wheezing.  Abdominal:     General: Bowel sounds are normal. There is no distension.     Palpations: Abdomen is soft.     Tenderness: There is no abdominal tenderness. There is no guarding or rebound.  Musculoskeletal:        General: Normal range of motion.     Cervical back: Normal range of motion.  Skin:    General: Skin is warm and dry.  Neurological:     Mental Status: She is alert.     ED Results / Procedures / Treatments   Labs (all labs ordered are listed, but only abnormal results are displayed) Labs Reviewed  POC URINE PREG, ED    EKG None  Radiology No results found.  Procedures Procedures    Medications Ordered in ED Medications - No data to display  ED Course/ Medical Decision Making/ A&P                             Medical Decision Making Patient with a fleeting intermittent a flutter sensation in her abdomen, concerned she might be pregnant.  Her pregnancy test today is negative.  She has no other complaints, her exam is reassuring, no guarding, no abdominal pain.  She is strongly encouraged to follow-up with the health department so she can get her Nexplanon updated, she was also advised to use another form of birth control or she risks pregnancy..  Follow-up anticipated.  Amount and/or Complexity of Data Reviewed Labs: ordered.    Details:  Bedside U pregnant is negative.           Final Clinical Impression(s) / ED Diagnoses Final diagnoses:  Negative pregnancy test    Rx / DC Orders ED Discharge Orders     None         Landis Martins 05/02/22 1726    Isla Pence, MD 05/02/22 1755

## 2022-07-02 ENCOUNTER — Emergency Department (HOSPITAL_COMMUNITY)
Admission: EM | Admit: 2022-07-02 | Discharge: 2022-07-02 | Disposition: A | Discharge status: Home / Self Care | Payer: Federal, State, Local not specified - PPO | Attending: Emergency Medicine | Admitting: Emergency Medicine

## 2022-07-02 ENCOUNTER — Other Ambulatory Visit: Payer: Self-pay

## 2022-07-02 ENCOUNTER — Encounter (HOSPITAL_COMMUNITY): Payer: Self-pay | Admitting: *Deleted

## 2022-07-02 DIAGNOSIS — R1032 Left lower quadrant pain: Secondary | ICD-10-CM | POA: Diagnosis not present

## 2022-07-02 DIAGNOSIS — R1030 Lower abdominal pain, unspecified: Secondary | ICD-10-CM

## 2022-07-02 DIAGNOSIS — R1031 Right lower quadrant pain: Secondary | ICD-10-CM | POA: Diagnosis present

## 2022-07-02 LAB — URINALYSIS, ROUTINE W REFLEX MICROSCOPIC
Bacteria, UA: NONE SEEN
Bilirubin Urine: NEGATIVE
Glucose, UA: NEGATIVE mg/dL
Ketones, ur: NEGATIVE mg/dL
Leukocytes,Ua: NEGATIVE
Nitrite: NEGATIVE
Protein, ur: NEGATIVE mg/dL
Specific Gravity, Urine: 1.023 (ref 1.005–1.030)
pH: 6 (ref 5.0–8.0)

## 2022-07-02 LAB — CBC WITH DIFFERENTIAL/PLATELET
Abs Immature Granulocytes: 0.04 10*3/uL (ref 0.00–0.07)
Basophils Absolute: 0.1 10*3/uL (ref 0.0–0.1)
Basophils Relative: 1 %
Eosinophils Absolute: 0.4 10*3/uL (ref 0.0–0.5)
Eosinophils Relative: 4 %
HCT: 38.4 % (ref 36.0–46.0)
Hemoglobin: 12.1 g/dL (ref 12.0–15.0)
Immature Granulocytes: 0 %
Lymphocytes Relative: 27 %
Lymphs Abs: 2.5 10*3/uL (ref 0.7–4.0)
MCH: 25.6 pg — ABNORMAL LOW (ref 26.0–34.0)
MCHC: 31.5 g/dL (ref 30.0–36.0)
MCV: 81.4 fL (ref 80.0–100.0)
Monocytes Absolute: 0.8 10*3/uL (ref 0.1–1.0)
Monocytes Relative: 9 %
Neutro Abs: 5.5 10*3/uL (ref 1.7–7.7)
Neutrophils Relative %: 59 %
Platelets: 359 10*3/uL (ref 150–400)
RBC: 4.72 MIL/uL (ref 3.87–5.11)
RDW: 14.4 % (ref 11.5–15.5)
WBC: 9.4 10*3/uL (ref 4.0–10.5)
nRBC: 0 % (ref 0.0–0.2)

## 2022-07-02 LAB — BASIC METABOLIC PANEL
Anion gap: 9 (ref 5–15)
BUN: 11 mg/dL (ref 6–20)
CO2: 25 mmol/L (ref 22–32)
Calcium: 8.6 mg/dL — ABNORMAL LOW (ref 8.9–10.3)
Chloride: 101 mmol/L (ref 98–111)
Creatinine, Ser: 0.77 mg/dL (ref 0.44–1.00)
GFR, Estimated: >60 mL/min (ref >60)
Glucose, Bld: 100 mg/dL — ABNORMAL HIGH (ref 70–99)
Potassium: 4.1 mmol/L (ref 3.5–5.1)
Sodium: 135 mmol/L (ref 135–145)

## 2022-07-02 LAB — PREGNANCY, URINE: Preg Test, Ur: NEGATIVE

## 2022-07-02 LAB — POC URINE PREG, ED: Preg Test, Ur: NEGATIVE

## 2022-07-02 MED ORDER — IBUPROFEN 400 MG PO TABS
600.0000 mg | ORAL_TABLET | Freq: Once | ORAL | Status: AC
  Administered 2022-07-02: 600 mg via ORAL
  Filled 2022-07-02: qty 2

## 2022-07-02 NOTE — ED Provider Notes (Signed)
[facility] Provider Note   CSN: [id number] Arrival date & time: 07/02/22  6789     History  Chief Complaint  Patient presents with   Abdominal Pain    Marie Rivera is a 32 y.o. female.  PMH.  Presents the ER for lower abdominal pain and pressure that started this morning suddenly.  States it was extremely painful and it made her "double over".  No vomiting or nausea.  No diarrhea or constipation.  She states the pain has eased up somewhat but she is worried it would come back as intense as it had been before.  States is painful to walk. She did have a gynecologic exam where everything was "normal" she does still have her Nexplanon in place and states it is due to be replaced.  She reports she had a normal period about a month ago.   Abdominal Pain      Home Medications Prior to Admission medications   Medication Sig Start Date End Date Taking? Authorizing Provider  ibuprofen (ADVIL,MOTRIN) 400 MG tablet Take 400 mg by mouth every 8 (eight) hours as needed for moderate pain.  03/03/18   [provider]  meloxicam (MOBIC) 15 MG tablet Take 1 tablet (15 mg total) by mouth daily. 09/21/19   Edwin Cap, DPM      Allergies    Patient has no known allergies.    Review of Systems   Review of Systems  Gastrointestinal:  Positive for abdominal pain.    Physical Exam Updated Vital Signs BP 112/66 (BP Location: Right Arm)   Pulse 67   Temp 99.4 F (37.4 C) (Oral)   Resp 17   Ht 5\' 2"  (1.575 m)   Wt 89.4 kg   LMP 06/02/2022   SpO2 99%   BMI 36.03 kg/m  Physical Exam Vitals and nursing note reviewed.  Constitutional:      General: She is not in acute distress.    Appearance: She is well-developed.  HENT:     Head: Normocephalic and atraumatic.  Eyes:     Conjunctiva/sclera: Conjunctivae normal.  Cardiovascular:     Rate and Rhythm: Normal rate and regular rhythm.     Heart sounds: No murmur  heard. Pulmonary:     Effort: Pulmonary effort is normal. No respiratory distress.     Breath sounds: Normal breath sounds.  Abdominal:     Palpations: Abdomen is soft.     Tenderness: There is abdominal tenderness in the right lower quadrant, suprapubic area and left lower quadrant. There is no right CVA tenderness or left CVA tenderness.  Genitourinary:    Comments: Patient refused pelvic exam Musculoskeletal:        General: No swelling.     Cervical back: Neck supple.  Skin:    General: Skin is warm and dry.     Capillary Refill: Capillary refill takes less than 2 seconds.  Neurological:     Mental Status: She is alert.  Psychiatric:        Mood and Affect: Mood normal.     ED Results / Procedures / Treatments   Labs (all labs ordered are listed, but only abnormal results are displayed) Labs Reviewed  BASIC METABOLIC PANEL - Abnormal; Notable for the following components:      Result Value   Glucose, Bld 100 (*)    Calcium 8.6 (*)    All other components within normal limits  CBC WITH DIFFERENTIAL/PLATELET - Abnormal;  Notable for the following components:   MCH 25.6 (*)    All other components within normal limits  URINALYSIS, ROUTINE W REFLEX MICROSCOPIC - Abnormal; Notable for the following components:   Hgb urine dipstick SMALL (*)    All other components within normal limits  PREGNANCY, URINE  POC URINE PREG, ED    EKG None  Radiology No results found.  Procedures Procedures    Medications Ordered in ED Medications  ibuprofen (ADVIL) tablet 600 mg (600 mg Oral Given 07/02/22 1256)    ED Course/ Medical Decision Making/ A&P                             Medical Decision Making This patient presents to the ED for concern of abdominal pain/pelvic pain that started this morning, now improved, this involves an extensive number of treatment options, and is a complaint that carries with it a high risk of complications and morbidity.  The differential  diagnosis includes ectopic pregnancy, ovarian torsion, UTI, ovarian cyst, appendicitis, diverticulitis, colitis, other   Co morbidities that complicate the patient evaluation  none   Additional history obtained:  Additional history obtained from EMR External records from outside source obtained and reviewed including prior ED and OB/GYN notes   Lab Tests:  I Ordered, and personally interpreted labs.  The pertinent results include: Negative pregnancy, CBC normal, BMP normal.  UA normal    Problem List / ED Course / Critical interventions / Medication management  Comes in with lower abdominal/pelvic pain today, was severe this morning and sudden onset but is now improved though not completely resolved.  No fevers or chills, denies vaginal discharge, no dysuria frequency or urgency.  It has slightly improved since coming to the ED.  Patient refused pelvic exam, refused self swab for GC chlamydia or wet mount.  States she just had these test at her gynecologist and they were normal.  Labs are reassuring, urinalysis does not show signs of a UTI.  I discussed with patient given her significant pain earlier, imaging would certainly be appropriate to rule out appendicitis, ovarian torsion as you have torsion/detorsion, kidney stones or other acute intra-abdominal process.  She verbalized understanding, she states she is feeling somewhat better and really does not want further testing at this time primarily because she does not want to wait longer and wants to see if she feels better after some medication for pain.  She states she will come back if her symptoms return or worsen.  We discussed the risks of this and she verbalized understanding.  Given her reassuring labs, soft abdomen and reassuring vitals this is certainly reasonable. She is competent to participate and shared decision making I ordered medication including ibuprofen  for pain  Reevaluation of the patient after these medicines showed  that the patient improved I have reviewed the patients home medicines and have made adjustments as needed    Test / Admission - Considered:  I considered pelvic ultrasound and/or CT abdomen pelvis with contrast.  Patient states her pain is improved from earlier and she is refusing any imaging at this time.    Amount and/or Complexity of Data Reviewed Labs: ordered.  Risk Prescription drug management.           Final Clinical Impression(s) / ED Diagnoses Final diagnoses:  Lower abdominal pain    Rx / DC Orders ED Discharge Orders     None  Josem Kaufmann 07/02/22 1746    Bethann Berkshire, MD 07/03/22 1714

## 2022-07-02 NOTE — Discharge Instructions (Signed)
It was a pleasure taking care of you today.  You were seen in the ER for lower abdominal/pelvic pain.  Your blood work and urinalysis are reassuring.  He did not want a pelvic exam, ultrasound or CT scan today so I am not sure the exact cause of your pain.  As discussed I cannot fully rule out appendicitis, ovarian cyst or torsion or any other dangerous cause of your pain.  Since your pain has gotten better, I do feel is reasonable for you to go home with some ibuprofen.  I think is extremely important that if you have any new or worsening symptoms or your symptoms come back you should come back to the ER right away.  [facility] Primary Care Doctor List    Syliva Overman, MD. Specialty: [facility] Medicine Contact information: [address]  [place] [address]  [phone]   Lilyan Punt, MD. Specialty: [facility] Medicine Contact information: [address]  [place] [address]  [phone]   Avon Gully, MD Specialty: Internal Medicine Contact information: [address] [place] [address]  [phone]   Catalina Pizza, MD. Specialty: Internal Medicine Contact information: [address]  [place] [address]  [phone]    [facility] (Dr. Selena Batten) Specialty: Family Medicine Contact information: [address]  [place] [address]  [phone]   John Giovanni, MD. Specialty: [facility] Medicine Contact information: [address]  [address]  [place] [address]  [phone]   Carylon Perches, MD. Specialty: Internal Medicine Contact information: [address]  [address]  [place] [address]  [phone]    [facility]  [address] [place], [place] [address] [phone]  Services [facility] offers a variety of basic health services.  Services include but are not limited to: Blood pressure checks  Heart rate checks  Blood sugar checks   Urine analysis  Rapid strep tests  Pregnancy tests.  Health education and referrals  People needing more complex services will be directed to a physician online. Using these virtual visits, doctors can evaluate and prescribe medicine and treatments. There will be no medication on-site, though [facility] will help patients fill their prescriptions at little to no cost.   For More information please go to: [email or web address]

## 2022-07-02 NOTE — ED Triage Notes (Signed)
Pt states she woke up this am with severe pressure to lower abdomen; pt denies any vaginal discharge or bleeding but states when she tried to have a BM or urinate it was a lot of pressure

## 2023-05-02 ENCOUNTER — Encounter (HOSPITAL_COMMUNITY): Payer: Self-pay | Admitting: *Deleted

## 2023-05-02 ENCOUNTER — Emergency Department (HOSPITAL_COMMUNITY)
Admission: EM | Admit: 2023-05-02 | Discharge: 2023-05-02 | Disposition: A | Discharge status: Home / Self Care | Attending: Student | Admitting: Student

## 2023-05-02 ENCOUNTER — Other Ambulatory Visit: Payer: Self-pay

## 2023-05-02 DIAGNOSIS — N898 Other specified noninflammatory disorders of vagina: Secondary | ICD-10-CM | POA: Diagnosis not present

## 2023-05-02 DIAGNOSIS — R21 Rash and other nonspecific skin eruption: Secondary | ICD-10-CM | POA: Diagnosis present

## 2023-05-02 DIAGNOSIS — N949 Unspecified condition associated with female genital organs and menstrual cycle: Secondary | ICD-10-CM

## 2023-05-02 LAB — URINALYSIS, ROUTINE W REFLEX MICROSCOPIC
Bilirubin Urine: NEGATIVE
Glucose, UA: NEGATIVE mg/dL
Ketones, ur: NEGATIVE mg/dL
Nitrite: NEGATIVE
Protein, ur: 30 mg/dL — AB
Specific Gravity, Urine: 1.026 (ref 1.005–1.030)
pH: 5 (ref 5.0–8.0)

## 2023-05-02 LAB — WET PREP, GENITAL
Clue Cells Wet Prep HPF POC: NONE SEEN
Sperm: NONE SEEN
Trich, Wet Prep: NONE SEEN
WBC, Wet Prep HPF POC: >10 — AB (ref <10)
Yeast Wet Prep HPF POC: NONE SEEN

## 2023-05-02 LAB — PREGNANCY, URINE: Preg Test, Ur: NEGATIVE

## 2023-05-02 MED ORDER — VALACYCLOVIR HCL 500 MG PO TABS
1000.0000 mg | ORAL_TABLET | Freq: Once | ORAL | Status: AC
  Administered 2023-05-02: 1000 mg via ORAL
  Filled 2023-05-02: qty 2

## 2023-05-02 MED ORDER — VALACYCLOVIR HCL 1 G PO TABS: 1000.0000 mg | ORAL_TABLET | Freq: Two times a day (BID) | ORAL | 0 refills | Status: AC

## 2023-05-02 NOTE — ED Triage Notes (Signed)
 Here by POV from home for abd and pubic area pain. Recent waxing, now has lesion above R labia. Pain to bilateral lower pubis. Denies fever, NVD, bleeding, urinary or vaginal sx. Onset Thursday night, 3/20. Waxing was 3/15.

## 2023-05-02 NOTE — Discharge Instructions (Addendum)
 Some of your test results are still pending.  You will be notified of any positive results.  Take the medication as directed follow-up with your primary care provider or return to the emergency department if you develop any new or worsening symptoms.  Avoid shaving or waxing at this time.

## 2023-05-03 LAB — HIV ANTIBODY (ROUTINE TESTING W REFLEX): HIV Screen 4th Generation wRfx: NONREACTIVE

## 2023-05-03 LAB — RPR: RPR Ser Ql: NONREACTIVE

## 2023-05-04 LAB — GC/CHLAMYDIA PROBE AMP (~~LOC~~) NOT AT ARMC
Chlamydia: NEGATIVE
Comment: NEGATIVE
Comment: NORMAL
Neisseria Gonorrhea: NEGATIVE

## 2023-05-05 LAB — HSV CULTURE AND TYPING

## 2023-05-05 NOTE — ED Provider Notes (Signed)
 [place] EMERGENCY DEPARTMENT AT [facility] Provider Note   CSN: [id number] Arrival date & time: 05/02/23  1813     History  Chief Complaint  Patient presents with   Abdominal Pain    Marie Rivera is a 33 y.o. female.   Abdominal Pain Associated symptoms: no dysuria, no fever, no nausea, no vaginal bleeding, no vaginal discharge and no vomiting        Marie Rivera is a 33 y.o. female who presents to the Emergency Department complaining of painful rash of her pubic area.  States that she had her genital area waxed for first time several days ago and soon after noticed a painful, stinging area at her lower abdomen/pelvic area.  She denies any pain or burning with urination, flank pain, fever, vomiting or chills.  States the skin to the area is very sensitive to touch.  She also denies any new sexual partners, vaginal bleeding or abnormal vaginal d/c  Home Medications Prior to Admission medications   Medication Sig Start Date End Date Taking? Authorizing Provider  valACYclovir (VALTREX) 1000 MG tablet Take 1 tablet (1,000 mg total) by mouth 2 (two) times daily. 05/02/23  Yes Ludwika Rodd, PA-C  ibuprofen (ADVIL,MOTRIN) 400 MG tablet Take 400 mg by mouth every 8 (eight) hours as needed for moderate pain.  03/03/18   [provider]  meloxicam (MOBIC) 15 MG tablet Take 1 tablet (15 mg total) by mouth daily. 09/21/19   Edwin Cap, DPM      Allergies    Patient has no known allergies.    Review of Systems   Review of Systems  Constitutional:  Negative for fever.  Gastrointestinal:  Positive for abdominal pain. Negative for nausea and vomiting.  Genitourinary:  Positive for genital sores. Negative for decreased urine volume, difficulty urinating, dysuria, flank pain, menstrual problem, pelvic pain, vaginal bleeding, vaginal discharge and vaginal pain.  Musculoskeletal:  Negative for back pain.  Neurological:  Negative for headaches.     Physical Exam Updated Vital Signs BP 110/66   Pulse 88   Temp 98.9 F (37.2 C)   Resp 18   Wt 89.4 kg   LMP 04/15/2023 (Exact Date)   SpO2 99%   BMI 36.03 kg/m  Physical Exam Vitals and nursing note reviewed.  Constitutional:      General: She is not in acute distress.    Appearance: Normal appearance. She is well-developed. She is not toxic-appearing.  Cardiovascular:     Rate and Rhythm: Normal rate and regular rhythm.     Pulses: Normal pulses.  Pulmonary:     Effort: Pulmonary effort is normal.     Breath sounds: Normal breath sounds.  Abdominal:     Palpations: Abdomen is soft. There is no mass.     Tenderness: There is no abdominal tenderness. There is no right CVA tenderness, left CVA tenderness or guarding.  Genitourinary:    Comments: Localized tender area of grouped vesilces of the mons pubis.  No drainage or significant erythema.   Musculoskeletal:        General: Normal range of motion.  Lymphadenopathy:     Lower Body: Right inguinal adenopathy present. Left inguinal adenopathy present.  Skin:    General: Skin is warm.     Capillary Refill: Capillary refill takes less than 2 seconds.     Findings: Rash present.  Neurological:     General: No focal deficit present.     Mental Status: She is  alert.     Sensory: No sensory deficit.     Motor: No weakness.     ED Results / Procedures / Treatments   Labs (all labs ordered are listed, but only abnormal results are displayed) Labs Reviewed  WET PREP, GENITAL - Abnormal; Notable for the following components:      Result Value   WBC, Wet Prep HPF POC >10 (*)    All other components within normal limits  URINALYSIS, ROUTINE W REFLEX MICROSCOPIC - Abnormal; Notable for the following components:   APPearance HAZY (*)    Hgb urine dipstick SMALL (*)    Protein, ur 30 (*)    Leukocytes,Ua SMALL (*)    Bacteria, UA RARE (*)    Crystals PRESENT (*)    All other components within normal limits  HSV  CULTURE AND TYPING  PREGNANCY, URINE  RPR  HIV ANTIBODY (ROUTINE TESTING W REFLEX)  GC/CHLAMYDIA PROBE AMP ([place]) NOT AT [facility]    EKG None  Radiology No results found.  Procedures Procedures    Medications Ordered in ED Medications  valACYclovir (VALTREX) tablet 1,000 mg (1,000 mg Oral Given 05/02/23 2033)    ED Course/ Medical Decision Making/ A&P                                 Medical Decision Making Pt here with painful lesion of her mons pubis after waxing the area.  No vaginal bleeding or abdnormal d/c, mongomous relationship.  No fever or abdominal pain.      Diff dx includes HSV, folliculitis, syphilis.  UTI felt less likely  no abdominal pain fever or vomiting to suggest acute surgical abdomen.  Amount and/or Complexity of Data Reviewed Labs: ordered.    Details: Urine without evidence of infection, preg neg, wet prep unremarkable.  GC chlamydia, HIV, RPR, HSV cultures pending Discussion of management or test interpretation with external provider(s):   Clinical concern for HSV.  Will have her begin antiviral until culture results.  Discussed safe sex practices.  Doubt surgical abdomen given lack of abdominal pain, fever and GI symptoms  Risk Prescription drug management.            Final Clinical Impression(s) / ED Diagnoses Final diagnoses:  Genital lesion, female    Rx / DC Orders ED Discharge Orders          Ordered    valACYclovir (VALTREX) 1000 MG tablet  2 times daily        05/02/23 2049              Pauline Aus, PA-C 05/05/23 1607    Kommor, [place], MD 05/07/23 1350

## 2023-05-09 ENCOUNTER — Emergency Department (HOSPITAL_COMMUNITY)
Admission: EM | Admit: 2023-05-09 | Discharge: 2023-05-09 | Disposition: A | Discharge status: Home / Self Care | Attending: Emergency Medicine | Admitting: Emergency Medicine

## 2023-05-09 ENCOUNTER — Encounter (HOSPITAL_COMMUNITY): Payer: Self-pay | Admitting: Emergency Medicine

## 2023-05-09 ENCOUNTER — Other Ambulatory Visit: Payer: Self-pay

## 2023-05-09 DIAGNOSIS — R3129 Other microscopic hematuria: Secondary | ICD-10-CM | POA: Diagnosis not present

## 2023-05-09 DIAGNOSIS — R197 Diarrhea, unspecified: Secondary | ICD-10-CM | POA: Insufficient documentation

## 2023-05-09 DIAGNOSIS — R112 Nausea with vomiting, unspecified: Secondary | ICD-10-CM | POA: Insufficient documentation

## 2023-05-09 LAB — CBC
HCT: 40.2 % (ref 36.0–46.0)
Hemoglobin: 12.7 g/dL (ref 12.0–15.0)
MCH: 25 pg — ABNORMAL LOW (ref 26.0–34.0)
MCHC: 31.6 g/dL (ref 30.0–36.0)
MCV: 79.3 fL — ABNORMAL LOW (ref 80.0–100.0)
Platelets: 375 10*3/uL (ref 150–400)
RBC: 5.07 MIL/uL (ref 3.87–5.11)
RDW: 14.1 % (ref 11.5–15.5)
WBC: 10.8 10*3/uL — ABNORMAL HIGH (ref 4.0–10.5)
nRBC: 0 % (ref 0.0–0.2)

## 2023-05-09 LAB — POC URINE PREG, ED: Preg Test, Ur: NEGATIVE

## 2023-05-09 LAB — COMPREHENSIVE METABOLIC PANEL WITH GFR
ALT: 18 U/L (ref 0–44)
AST: 16 U/L (ref 15–41)
Albumin: 3.9 g/dL (ref 3.5–5.0)
Alkaline Phosphatase: 80 U/L (ref 38–126)
Anion gap: 10 (ref 5–15)
BUN: 15 mg/dL (ref 6–20)
CO2: 21 mmol/L — ABNORMAL LOW (ref 22–32)
Calcium: 8.9 mg/dL (ref 8.9–10.3)
Chloride: 104 mmol/L (ref 98–111)
Creatinine, Ser: 0.77 mg/dL (ref 0.44–1.00)
GFR, Estimated: >60 mL/min (ref >60)
Glucose, Bld: 102 mg/dL — ABNORMAL HIGH (ref 70–99)
Potassium: 3.6 mmol/L (ref 3.5–5.1)
Sodium: 135 mmol/L (ref 135–145)
Total Bilirubin: 0.8 mg/dL (ref 0.0–1.2)
Total Protein: 7.9 g/dL (ref 6.5–8.1)

## 2023-05-09 LAB — URINALYSIS, ROUTINE W REFLEX MICROSCOPIC
Bilirubin Urine: NEGATIVE
Glucose, UA: NEGATIVE mg/dL
Ketones, ur: 20 mg/dL — AB
Nitrite: NEGATIVE
Protein, ur: 100 mg/dL — AB
Specific Gravity, Urine: 1.029 (ref 1.005–1.030)
pH: 5 (ref 5.0–8.0)

## 2023-05-09 LAB — LIPASE, BLOOD: Lipase: 24 U/L (ref 11–51)

## 2023-05-09 MED ORDER — ONDANSETRON HCL 4 MG/2ML IJ SOLN
4.0000 mg | Freq: Once | INTRAMUSCULAR | Status: AC
  Administered 2023-05-09: 4 mg via INTRAVENOUS
  Filled 2023-05-09: qty 2

## 2023-05-09 MED ORDER — ACETAMINOPHEN 325 MG PO TABS
650.0000 mg | ORAL_TABLET | Freq: Once | ORAL | Status: AC
  Administered 2023-05-09: 650 mg via ORAL
  Filled 2023-05-09: qty 2

## 2023-05-09 MED ORDER — SODIUM CHLORIDE 0.9 % IV BOLUS
1000.0000 mL | Freq: Once | INTRAVENOUS | Status: AC
  Administered 2023-05-09: 1000 mL via INTRAVENOUS

## 2023-05-09 MED ORDER — ONDANSETRON 4 MG PO TBDP: 4.0000 mg | ORAL_TABLET | Freq: Four times a day (QID) | ORAL | 0 refills | Status: AC | PRN

## 2023-05-09 NOTE — ED Triage Notes (Signed)
 Pt arrived via POV c/o N/V/D since this morning. Pt states son recently had same symptoms.

## 2023-05-09 NOTE — ED Provider Notes (Signed)
 [place] EMERGENCY DEPARTMENT AT [facility] Provider Note   CSN: [id number] Arrival date & time: 05/09/23  2006     History  Chief Complaint  Patient presents with   N/V/D    Marie Rivera is a 33 y.o. female. She denies any chronic illnesses.  Presents to ER complaining of nausea vomiting diarrhea that started this morning, reports her child had similar symptoms several days ago as well.  Denies hematemesis or hematochezia, denies abdominal pain.  She also denies dysuria, urinary frequency, and flank pain.  HPI     Home Medications Prior to Admission medications   Medication Sig Start Date End Date Taking? Authorizing Provider  ondansetron (ZOFRAN-ODT) 4 MG disintegrating tablet Take 1 tablet (4 mg total) by mouth every 6 (six) hours as needed for nausea or vomiting. 05/09/23  Yes Aaiden Depoy A, PA-C  ibuprofen (ADVIL,MOTRIN) 400 MG tablet Take 400 mg by mouth every 8 (eight) hours as needed for moderate pain.  03/03/18   [provider]  meloxicam (MOBIC) 15 MG tablet Take 1 tablet (15 mg total) by mouth daily. 09/21/19   McDonald, Rachelle Hora, DPM  valACYclovir (VALTREX) 1000 MG tablet Take 1 tablet (1,000 mg total) by mouth 2 (two) times daily. 05/02/23   Triplett, Babette Relic, PA-C      Allergies    Patient has no known allergies.    Review of Systems   Review of Systems  Physical Exam Updated Vital Signs BP 112/70 (BP Location: Left Arm)   Pulse 89   Temp (!) 100.7 F (38.2 C) (Oral)   Resp 17   Ht 5\' 2"  (1.575 m)   Wt 89.4 kg   LMP 04/15/2023 (Exact Date)   SpO2 98%   BMI 36.03 kg/m  Physical Exam Vitals and nursing note reviewed.  Constitutional:      General: She is not in acute distress.    Appearance: She is well-developed.  HENT:     Head: Normocephalic and atraumatic.     Mouth/Throat:     Mouth: Mucous membranes are moist.  Eyes:     Conjunctiva/sclera: Conjunctivae normal.     Pupils: Pupils are equal, round, and reactive to  light.  Cardiovascular:     Rate and Rhythm: Normal rate and regular rhythm.     Heart sounds: No murmur heard. Pulmonary:     Effort: Pulmonary effort is normal. No respiratory distress.     Breath sounds: Normal breath sounds.  Abdominal:     General: Abdomen is flat.     Palpations: Abdomen is soft. There is no mass.     Tenderness: There is no abdominal tenderness. There is no guarding or rebound.  Musculoskeletal:        General: No swelling.     Cervical back: Neck supple.  Skin:    General: Skin is warm and dry.     Capillary Refill: Capillary refill takes less than 2 seconds.  Neurological:     General: No focal deficit present.     Mental Status: She is alert and oriented to person, place, and time.  Psychiatric:        Mood and Affect: Mood normal.     ED Results / Procedures / Treatments   Labs (all labs ordered are listed, but only abnormal results are displayed) Labs Reviewed  COMPREHENSIVE METABOLIC PANEL WITH GFR - Abnormal; Notable for the following components:      Result Value   CO2 21 (*)  Glucose, Bld 102 (*)    All other components within normal limits  CBC - Abnormal; Notable for the following components:   WBC 10.8 (*)    MCV 79.3 (*)    MCH 25.0 (*)    All other components within normal limits  URINALYSIS, ROUTINE W REFLEX MICROSCOPIC - Abnormal; Notable for the following components:   APPearance CLOUDY (*)    Hgb urine dipstick SMALL (*)    Ketones, ur 20 (*)    Protein, ur 100 (*)    Leukocytes,Ua TRACE (*)    Bacteria, UA RARE (*)    All other components within normal limits  LIPASE, BLOOD  POC URINE PREG, ED    EKG None  Radiology No results found.  Procedures Procedures    Medications Ordered in ED Medications  acetaminophen (TYLENOL) tablet 650 mg (has no administration in time range)  ondansetron (ZOFRAN) injection 4 mg (4 mg Intravenous Given 05/09/23 2055)  sodium chloride 0.9 % bolus 1,000 mL (1,000 mLs Intravenous  New Bag/Given 05/09/23 2055)    ED Course/ Medical Decision Making/ A&P                                 Medical Decision Making DDx: Gastroenteritis, gastritis, pancreatitis, hyperemesis gravidarum, UTI, cholecystitis, appendicitis, DKA, cannabis hyperemesis syndrome, other  Labs: Ordered and interpreted by me.  patient has white blood cell count 10.8, normal hemoglobin, renal function is normal, no increased anion gap, CO2 slightly decreased at 21,  pregnancy test is negative, UA shows small amount of ketones, trace leukocytes, 11-20 red blood cells and rare bacteria, lipase is normal  Imaging: I considered imaging but patient has no abdominal pain or tenderness, no significant leukocytosis.  Symptoms controlled with IV Zofran and she is tolerating p.o. fluids, do not feel there is need for imaging at this time.  ED course: Patient here for nausea vomiting diarrhea that started this morning with recent sick contact at home as her son had similar symptoms earlier this week.  She had temperature 100.7 but vitals were otherwise normal, she is feeling much better after Zofran and fluids she has no abdominal pain or tenderness.  She is tolerating p.o. fluids.  Feel this is consistent with a viral gastroenteritis.  Considered the above differential but do not feel she needs further workup given her reassuring labs.    She did have microscopic hematuria she was informed of advised to follow-up with her PCP for recheck.  She is not having UTI symptoms, not having flank pain to suggest a kidney stone.  I do not feel she needs further treatment or workup for this in the emergency room.  Is requesting to go home, states she wants to get her bed, she feeling much better, will prescribe Zofran for home, given Tylenol for temperature of 100.7 Fahrenheit in the ED.  Given strict return precautions.      Amount and/or Complexity of Data Reviewed External Data Reviewed: labs and notes. Labs:  ordered.  Risk OTC drugs. Prescription drug management.           Final Clinical Impression(s) / ED Diagnoses Final diagnoses:  Nausea vomiting and diarrhea  Microscopic hematuria    Rx / DC Orders ED Discharge Orders          Ordered    ondansetron (ZOFRAN-ODT) 4 MG disintegrating tablet  Every 6 hours PRN        05/09/23  2223              Josem Kaufmann 05/09/23 2232    Bethann Berkshire, MD 05/10/23 1134

## 2023-05-09 NOTE — Discharge Instructions (Signed)
 Was a pleasure taking care of you today.  You were seen in the ER for nausea vomiting and diarrhea.  You had a slight fever, your workup otherwise was reassuring.  And that your son had similar symptoms recently, this is likely a viral illness that should resolve over the next 24 to 48 hours, drink plenty of fluids, eat a bland diet.  Would typically recommend a brat diet which stands for bananas rice applesauce and toast.  Avoid fatty foods or spicy foods.  Over-the-counter probiotic can help decrease diarrhea as well.  Take the Zofran as needed for nausea.  Follow-up with your primary care doctor.  Come back to the ER if you develop persistent vomiting, fevers or if you have abdominal pain or other worrisome changes.  Come back to the ER right away.  As discussed you had some blood in your urine.  You need to follow-up with your primary care doctor to have this rechecked.
